# Patient Record
Sex: Female | Born: 2000 | Hispanic: No | Marital: Single | Smoking: Never smoker
Health system: Southern US, Community
[De-identification: ages and names within clinical notes are randomized; demographics above are authoritative.]

## PROBLEM LIST (undated history)

## (undated) DIAGNOSIS — J45909 Unspecified asthma, uncomplicated: Secondary | ICD-10-CM

## (undated) DIAGNOSIS — D649 Anemia, unspecified: Secondary | ICD-10-CM

---
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
## 2021-03-10 ENCOUNTER — Other Ambulatory Visit: Payer: Self-pay

## 2021-03-10 ENCOUNTER — Emergency Department: Payer: Medicaid Other

## 2021-03-10 ENCOUNTER — Emergency Department
Admission: EM | Admit: 2021-03-10 | Discharge: 2021-03-10 | Disposition: A | Discharge status: Home / Self Care | Payer: Medicaid Other | Attending: Emergency Medicine | Admitting: Emergency Medicine

## 2021-03-10 ENCOUNTER — Encounter: Payer: Self-pay | Admitting: Emergency Medicine

## 2021-03-10 DIAGNOSIS — Z7951 Long term (current) use of inhaled steroids: Secondary | ICD-10-CM | POA: Diagnosis not present

## 2021-03-10 DIAGNOSIS — J4521 Mild intermittent asthma with (acute) exacerbation: Secondary | ICD-10-CM | POA: Diagnosis not present

## 2021-03-10 DIAGNOSIS — R0602 Shortness of breath: Secondary | ICD-10-CM | POA: Diagnosis present

## 2021-03-10 HISTORY — DX: Unspecified asthma, uncomplicated: J45.909

## 2021-03-10 MED ORDER — PREDNISONE 20 MG PO TABS
60.0000 mg | ORAL_TABLET | Freq: Once | ORAL | Status: AC
  Administered 2021-03-10: 60 mg via ORAL
  Filled 2021-03-10: qty 3

## 2021-03-10 MED ORDER — PREDNISONE 10 MG PO TABS: ORAL_TABLET | ORAL | 0 refills | Status: DC

## 2021-03-10 MED ORDER — IPRATROPIUM-ALBUTEROL 0.5-2.5 (3) MG/3ML IN SOLN
3.0000 mL | Freq: Once | RESPIRATORY_TRACT | Status: AC
  Administered 2021-03-10: 3 mL via RESPIRATORY_TRACT
  Filled 2021-03-10: qty 3

## 2021-03-10 MED ORDER — ALBUTEROL SULFATE HFA 108 (90 BASE) MCG/ACT IN AERS: 2.0000 | INHALATION_SPRAY | Freq: Four times a day (QID) | RESPIRATORY_TRACT | 2 refills | Status: DC | PRN

## 2021-03-10 NOTE — ED Notes (Signed)
Pt states hx of asthma and recently moved here and does not have inhaler/new prescriptions

## 2021-03-10 NOTE — ED Provider Notes (Signed)
[facility] Emergency Department Provider Note  ____________________________________________   Event Date/Time   First MD Initiated Contact with Patient 03/10/21 [id number]     (approximate)  I have reviewed the triage vital signs and the nursing notes.   HISTORY  Chief Complaint Asthma   HPI April Olsen is a 20 y.o. female presents to the ED with complaint of exacerbation of her asthma.  Patient states that she has had asthma most of her life.  She woke this morning with shortness of breath and her inhaler had expired.  She denies any fever or chills.       Past Medical History:  Diagnosis Date  . Asthma     There are no problems to display for this patient.   History reviewed. No pertinent surgical history.  Prior to Admission medications   Medication Sig Start Date End Date Taking? Authorizing Provider  albuterol (VENTOLIN HFA) 108 (90 Base) MCG/ACT inhaler Inhale 2 puffs into the lungs every 6 (six) hours as needed for wheezing or shortness of breath. 03/10/21  Yes Tommi Rumps, PA-C  predniSONE (DELTASONE) 10 MG tablet Take 5 tablets tomorrow, on day 2 take 4 tablets, day 3 take 3 tablets, day 4 take 2 tablets, day 5 take 1 tablets. 03/10/21  Yes Bridget Hartshorn L, PA-C    Allergies Motrin [ibuprofen] and Sulfa antibiotics  No family history on file.  Social History Social History   Tobacco Use  . Smoking status: Never Smoker  . Smokeless tobacco: Never Used  Vaping Use  . Vaping Use: Never used    Review of Systems Constitutional: No fever/chills Eyes: No visual changes. ENT: No sore throat. Cardiovascular: Denies chest pain. Respiratory: Positive shortness of breath with wheezing. Gastrointestinal: No abdominal pain.  No nausea, no vomiting.  No diarrhea. Musculoskeletal: Negative for musculoskeletal pain. Skin: Negative for rash. Neurological: Negative for headaches, focal weakness or  numbness.  ____________________________________________   PHYSICAL EXAM:  VITAL SIGNS: ED Triage Vitals  Enc Vitals Group     BP 03/10/21 0633 123/72     Pulse Rate 03/10/21 0633 79     Resp 03/10/21 0633 20     Temp 03/10/21 0633 97.9 F (36.6 C)     Temp Source 03/10/21 0633 Oral     SpO2 03/10/21 0633 95 %     Weight 03/10/21 0634 155 lb (70.3 kg)     Height 03/10/21 0634 5\' 7"  (1.702 m)     Head Circumference --      Peak Flow --      Pain Score --      Pain Loc --      Pain Edu? --      Excl. in GC? --    Constitutional: Alert and oriented. Well appearing and in no acute distress. Eyes: Conjunctivae are normal. PERRL. EOMI. Head: Atraumatic. Nose: No congestion/rhinnorhea. Mouth/Throat: Mucous membranes are moist.  Oropharynx non-erythematous. Neck: No stridor.   Cardiovascular: Normal rate, regular rhythm. Grossly normal heart sounds.  Good peripheral circulation. Respiratory: Normal respiratory effort.  No retractions. Lungs bilateral expiratory wheezes are heard throughout.  Patient is still able to speak in complete sentences without any difficulty. Gastrointestinal: Soft and nontender. No distention.  Musculoskeletal: Moves upper and lower extremities that any difficulty normal gait was noted. Neurologic:  Normal speech and language. No gross focal neurologic deficits are appreciated. No gait instability. Skin:  Skin is warm, dry and intact. No rash noted. Psychiatric: Mood and affect are  normal. Speech and behavior are normal.  ____________________________________________   LABS (all labs ordered are listed, but only abnormal results are displayed)  Labs Reviewed - No data to display ____________________________________________   RADIOLOGY I, Tommi Rumps, personally viewed and evaluated these images (plain radiographs) as part of my medical decision making, as well as reviewing the written report by the radiologist.   Official radiology  report(s): DG Chest 2 View  Result Date: 03/10/2021 CLINICAL DATA:  20 year old female with nonproductive cough and shortness of breath. EXAM: CHEST - 2 VIEW COMPARISON:  None. FINDINGS: Normal lung volumes and mediastinal contours. Visualized tracheal air column is within normal limits. Both lungs appear clear. No pneumothorax or pleural effusion. No osseous abnormality identified. Negative visible bowel gas pattern. IMPRESSION: Negative.  No acute cardiopulmonary abnormality. Electronically Signed   By: Odessa Fleming M.D.   On: 03/10/2021 07:21    ____________________________________________   PROCEDURES  Procedure(s) performed (including Critical Care):  Procedures   ____________________________________________   INITIAL IMPRESSION / ASSESSMENT AND PLAN / ED COURSE  As part of my medical decision making, I reviewed the following data within the electronic MEDICAL RECORD NUMBER Notes from prior ED visits and [place] Controlled Substance Database  20 year old female presents to the ED with exacerbation of her asthma.  Patient states that her inhaler had expired and she woke up this morning with her usual asthma.  She was given a DuoNeb treatment while in the ED along with 60 mg of prednisone and began improving.  O2 sat was 95% and patient was ambulatory without any respiratory distress.  She is still able to speak in complete sentences without any difficulty.  A prescription for an albuterol inhaler and prednisone for the next 5 days was sent to her pharmacy.  She is to follow-up with her PCP if any continued problems.  She is also given strict return precautions should she develop any worsening of her symptoms.  ____________________________________________   FINAL CLINICAL IMPRESSION(S) / ED DIAGNOSES  Final diagnoses:  Mild intermittent asthma with exacerbation     ED Discharge Orders         Ordered    albuterol (VENTOLIN HFA) 108 (90 Base) MCG/ACT inhaler  Every 6 hours PRN        03/10/21  0851    predniSONE (DELTASONE) 10 MG tablet        03/10/21 4008           Note:  This document was prepared using Dragon voice recognition software and may include unintentional dictation errors.    Tommi Rumps, PA-C 03/10/21 0857    Gilles Chiquito, MD 03/10/21 [phone]

## 2021-03-10 NOTE — Discharge Instructions (Signed)
Follow-up with your primary care provider if any continued problems or concerns.  A prescription for an albuterol inhaler and the remainder of your prednisone was sent to the pharmacy.  Take as directed.  Return to the emergency department if any severe worsening of your symptoms such as difficulty breathing or shortness of breath.

## 2021-03-10 NOTE — ED Notes (Signed)
Pt states feeling better after breathing tx

## 2021-03-10 NOTE — ED Triage Notes (Signed)
Pt arrived via POV with reports of asthma problem, pt states she does not have an inhaler. Woke up during the night with dry cough, states she was short of breath and if she had been able to use her inhaler she would have felt better.  No distress noted on arrival, pt able to speak in complete sentences.

## 2021-03-23 ENCOUNTER — Encounter: Payer: Self-pay | Admitting: Emergency Medicine

## 2021-03-23 ENCOUNTER — Emergency Department
Admission: EM | Admit: 2021-03-23 | Discharge: 2021-03-23 | Disposition: A | Discharge status: Home / Self Care | Payer: Medicaid Other | Attending: Emergency Medicine | Admitting: Emergency Medicine

## 2021-03-23 ENCOUNTER — Other Ambulatory Visit: Payer: Self-pay

## 2021-03-23 ENCOUNTER — Emergency Department: Payer: Medicaid Other

## 2021-03-23 DIAGNOSIS — Z3A01 Less than 8 weeks gestation of pregnancy: Secondary | ICD-10-CM | POA: Insufficient documentation

## 2021-03-23 DIAGNOSIS — J45909 Unspecified asthma, uncomplicated: Secondary | ICD-10-CM | POA: Insufficient documentation

## 2021-03-23 DIAGNOSIS — O219 Vomiting of pregnancy, unspecified: Secondary | ICD-10-CM | POA: Diagnosis not present

## 2021-03-23 DIAGNOSIS — R102 Pelvic and perineal pain: Secondary | ICD-10-CM

## 2021-03-23 LAB — URINALYSIS, COMPLETE (UACMP) WITH MICROSCOPIC
Bilirubin Urine: NEGATIVE
Glucose, UA: NEGATIVE mg/dL
Hgb urine dipstick: NEGATIVE
Ketones, ur: 80 mg/dL — AB
Nitrite: NEGATIVE
Protein, ur: 100 mg/dL — AB
Specific Gravity, Urine: 1.032 — ABNORMAL HIGH (ref 1.005–1.030)
pH: 6 (ref 5.0–8.0)

## 2021-03-23 LAB — COMPREHENSIVE METABOLIC PANEL
ALT: 11 U/L (ref 0–44)
AST: 23 U/L (ref 15–41)
Albumin: 4.9 g/dL (ref 3.5–5.0)
Alkaline Phosphatase: 53 U/L (ref 38–126)
Anion gap: 15 (ref 5–15)
BUN: 13 mg/dL (ref 6–20)
CO2: 20 mmol/L — ABNORMAL LOW (ref 22–32)
Calcium: 10.1 mg/dL (ref 8.9–10.3)
Chloride: 101 mmol/L (ref 98–111)
Creatinine, Ser: 0.77 mg/dL (ref 0.44–1.00)
GFR, Estimated: >60 mL/min (ref >60)
Glucose, Bld: 80 mg/dL (ref 70–99)
Potassium: 4.1 mmol/L (ref 3.5–5.1)
Sodium: 136 mmol/L (ref 135–145)
Total Bilirubin: 2.4 mg/dL — ABNORMAL HIGH (ref 0.3–1.2)
Total Protein: 8.8 g/dL — ABNORMAL HIGH (ref 6.5–8.1)

## 2021-03-23 LAB — CBC
HCT: 43.2 % (ref 36.0–46.0)
Hemoglobin: 13.7 g/dL (ref 12.0–15.0)
MCH: 23.9 pg — ABNORMAL LOW (ref 26.0–34.0)
MCHC: 31.7 g/dL (ref 30.0–36.0)
MCV: 75.4 fL — ABNORMAL LOW (ref 80.0–100.0)
Platelets: 384 10*3/uL (ref 150–400)
RBC: 5.73 MIL/uL — ABNORMAL HIGH (ref 3.87–5.11)
RDW: 19.6 % — ABNORMAL HIGH (ref 11.5–15.5)
WBC: 7.3 10*3/uL (ref 4.0–10.5)
nRBC: 0 % (ref 0.0–0.2)

## 2021-03-23 LAB — LIPASE, BLOOD: Lipase: 25 U/L (ref 11–51)

## 2021-03-23 LAB — HCG, QUANTITATIVE, PREGNANCY: hCG, Beta Chain, Quant, S: 42102 m[IU]/mL — ABNORMAL HIGH (ref <5)

## 2021-03-23 MED ORDER — CEPHALEXIN 500 MG PO CAPS
1000.0000 mg | ORAL_CAPSULE | Freq: Once | ORAL | Status: AC
  Administered 2021-03-23: 1000 mg via ORAL
  Filled 2021-03-23: qty 2

## 2021-03-23 MED ORDER — SODIUM CHLORIDE 0.9 % IV BOLUS
1000.0000 mL | Freq: Once | INTRAVENOUS | Status: AC
  Administered 2021-03-23: 1000 mL via INTRAVENOUS

## 2021-03-23 MED ORDER — ONDANSETRON 4 MG PO TBDP: 4.0000 mg | ORAL_TABLET | Freq: Three times a day (TID) | ORAL | 0 refills | Status: DC | PRN

## 2021-03-23 MED ORDER — ONDANSETRON HCL 4 MG/2ML IJ SOLN
4.0000 mg | Freq: Once | INTRAMUSCULAR | Status: AC
  Administered 2021-03-23: 4 mg via INTRAVENOUS
  Filled 2021-03-23: qty 2

## 2021-03-23 MED ORDER — CEPHALEXIN 500 MG PO CAPS: 500.0000 mg | ORAL_CAPSULE | Freq: Four times a day (QID) | ORAL | 0 refills | Status: AC

## 2021-03-23 NOTE — Discharge Instructions (Addendum)
For your UTI, take the antibiotics as prescribed.   You make take over the counter medications for nausea and vomiting in pregnancy, such as taking 1/2 tablet of Unisom (12.5mg ) and 25 mg of B6 at night time. If this is not effective in treating your nausea and vomiting, you may use the prescribed Zofran every 8 hours.   Call OB on Monday to make a follow up appointment.   Return to the ER if you are unable to keep anything down despite your medications, or if you develop any other worsening symptoms.

## 2021-03-23 NOTE — ED Provider Notes (Signed)
[facility] Emergency Department Provider Note  ____________________________________________   Event Date/Time   First MD Initiated Contact with Patient 03/23/21 1225     (approximate)  I have reviewed the triage vital signs and the nursing notes.   HISTORY  Chief Complaint Emesis During Pregnancy   HPI April Olsen is a 20 y.o. female who reports to the ER for evaluation of nausea and vomiting for the last 3 days. She states she has not been able to keep anything down including water over that time. She reports recently taking a positive home pregnancy test, has not yet established with OB. She reports intermittent lower abdominal cramping, denies any vaginal bleeding or leaking, denies concern for STD, denies dysuria or fevers. She has history of healthy pregnancy 2 years ago. Reports LMP was approximately 4 weeks ago, states she is usually regular.         Past Medical History:  Diagnosis Date   Asthma     There are no problems to display for this patient.   History reviewed. No pertinent surgical history.  Prior to Admission medications   Medication Sig Start Date End Date Taking? Authorizing Provider  cephALEXin (KEFLEX) 500 MG capsule Take 1 capsule (500 mg total) by mouth 4 (four) times daily for 7 days. 03/23/21 03/30/21 Yes Jarman Litton, Ruben Gottron, PA  ondansetron (ZOFRAN ODT) 4 MG disintegrating tablet Take 1 tablet (4 mg total) by mouth every 8 (eight) hours as needed for nausea or vomiting. 03/23/21  Yes Lucy Chris, PA  albuterol (VENTOLIN HFA) 108 (90 Base) MCG/ACT inhaler Inhale 2 puffs into the lungs every 6 (six) hours as needed for wheezing or shortness of breath. 03/10/21   Tommi Rumps, PA-C  predniSONE (DELTASONE) 10 MG tablet Take 5 tablets tomorrow, on day 2 take 4 tablets, day 3 take 3 tablets, day 4 take 2 tablets, day 5 take 1 tablets. 03/10/21   Tommi Rumps, PA-C    Allergies Motrin [ibuprofen] and Sulfa  antibiotics  History reviewed. No pertinent family history.  Social History Social History   Tobacco Use   Smoking status: Never   Smokeless tobacco: Never  Vaping Use   Vaping Use: Never used    Review of Systems Constitutional: No fever/chills Eyes: No visual changes. ENT: No sore throat. Cardiovascular: Denies chest pain. Respiratory: Denies shortness of breath. Gastrointestinal: + abdominal cramping.  + nausea, + vomiting.  No diarrhea.  No constipation. Genitourinary: Negative for dysuria. Musculoskeletal: Negative for back pain. Skin: Negative for rash. Neurological: Negative for headaches, focal weakness or numbness.   ____________________________________________   PHYSICAL EXAM:  VITAL SIGNS: ED Triage Vitals  Enc Vitals Group     BP 03/23/21 1206 123/85     Pulse Rate 03/23/21 1206 94     Resp 03/23/21 1206 18     Temp 03/23/21 1206 97.8 F (36.6 C)     Temp Source 03/23/21 1206 Oral     SpO2 03/23/21 1206 98 %     Weight 03/23/21 1206 150 lb (68 kg)     Height 03/23/21 1206 5\' 6"  (1.676 m)     Head Circumference --      Peak Flow --      Pain Score 03/23/21 1210 7     Pain Loc --      Pain Edu? --      Excl. in GC? --    Constitutional: Alert and oriented. Well appearing and in no acute distress. Eyes:  Conjunctivae are normal. PERRL. EOMI. Head: Atraumatic. Nose: No congestion/rhinnorhea. Mouth/Throat: Mucous membranes are moist.  Oropharynx non-erythematous. Neck: No stridor.  Cardiovascular: Normal rate, regular rhythm. Grossly normal heart sounds.  Good peripheral circulation. Respiratory: Normal respiratory effort.  No retractions. Lungs CTAB. Gastrointestinal: Soft and nontender. No distention. No abdominal bruits. No CVA tenderness. Musculoskeletal: No lower extremity tenderness nor edema.  No joint effusions. Neurologic:  Normal speech and language. No gross focal neurologic deficits are appreciated. No gait instability. Skin:  Skin is  warm, dry and intact. No rash noted. Psychiatric: Mood and affect are normal. Speech and behavior are normal.  ____________________________________________   LABS (all labs ordered are listed, but only abnormal results are displayed)  Labs Reviewed  COMPREHENSIVE METABOLIC PANEL - Abnormal; Notable for the following components:      Result Value   CO2 20 (*)    Total Protein 8.8 (*)    Total Bilirubin 2.4 (*)    All other components within normal limits  CBC - Abnormal; Notable for the following components:   RBC 5.73 (*)    MCV 75.4 (*)    MCH 23.9 (*)    RDW 19.6 (*)    All other components within normal limits  URINALYSIS, COMPLETE (UACMP) WITH MICROSCOPIC - Abnormal; Notable for the following components:   Color, Urine YELLOW (*)    APPearance HAZY (*)    Specific Gravity, Urine 1.032 (*)    Ketones, ur 80 (*)    Protein, ur 100 (*)    Leukocytes,Ua SMALL (*)    Bacteria, UA RARE (*)    All other components within normal limits  HCG, QUANTITATIVE, PREGNANCY - Abnormal; Notable for the following components:   hCG, Beta Chain, Quant, S 42,102 (*)    All other components within normal limits  LIPASE, BLOOD   ____________________________________________  RADIOLOGY  Official radiology report(s): US OB LESS THAN 14 WEEKS WITH OB TRANSVAGINAL  Result Date: 03/23/2021 CLINICAL DATA:  Pregnant patient with abdominal cramping. EXAM: OBSTETRIC <14 WK [place] AND TRANSVAGINAL OB US TECHNIQUE: Both transabdominal and transvaginal ultrasound examinations were performed for complete evaluation of the gestation as well as the maternal uterus, adnexal regions, and pelvic cul-de-sac. Transvaginal technique was performed to assess early pregnancy. COMPARISON:  None. FINDINGS: Intrauterine gestational sac: Single Yolk sac:  Visualized. Embryo:  Visualized. Cardiac Activity: Visualized. Heart Rate: 99 bpm CRL:  3.1 mm   5 w   6 d                  [place] EDC: 11/17/2021 Subchorionic hemorrhage:  None  Maternal uterus/adnexae: Right ovarian corpus luteum. Normal left ovary. No free fluid in the pelvis. IMPRESSION: Single live intrauterine gestation.  No subchorionic hemorrhage. Electronically Signed   By: Annia Belt M.D.   On: 03/23/2021 14:15     ____________________________________________   INITIAL IMPRESSION / ASSESSMENT AND PLAN / ED COURSE  As part of my medical decision making, I reviewed the following data within the electronic MEDICAL RECORD NUMBER Nursing notes reviewed and incorporated, Labs reviewed, and Notes from prior ED visits        Patient is a 20 yo female who presents to the ER for evaluation for evaluation of abdominal pain, nausea and vomiting in pregnancy. See HPI for full details.  In triage, patient has normal vitals.   On PE, no gross abnormalities. Laboratory evaluation was obtained including CBC, CMP, quant hcg, urinalysis. Urine shows rare bacteria and small leukocytes as well as  80 ketones and proteinuria. Will initiate treatment for bacturia in pregnancy. Quant HCG is 42,102 and US shows normal IUP. No bleeding, thus no ABO/Rh obtained. Given 1 L of IV fluids as well as zofran with improvement in nausea/vomiting and is now tolerating PO at bedside. Will discharge with a short course of this medication with plans for close follow up OB care. Patient is amenable with plan, return precautions discussed at length and patient is stable at this time for outpatient follow up.       ____________________________________________   FINAL CLINICAL IMPRESSION(S) / ED DIAGNOSES  Final diagnoses:  Nausea and vomiting in pregnancy     ED Discharge Orders          Ordered    cephALEXin (KEFLEX) 500 MG capsule  4 times daily        03/23/21 1613    ondansetron (ZOFRAN ODT) 4 MG disintegrating tablet  Every 8 hours PRN        03/23/21 1613             Note:  This document was prepared using Dragon voice recognition software and may include unintentional  dictation errors.    Lucy Chris, PA 03/24/21 1537    Jene Every, MD 03/25/21 1440

## 2021-03-23 NOTE — ED Triage Notes (Signed)
Pt states recently found out she was pregnant, c/o N/V x 3 days. Pt states G2P1L1.

## 2021-05-15 ENCOUNTER — Ambulatory Visit (INDEPENDENT_AMBULATORY_CARE_PROVIDER_SITE_OTHER): Payer: Medicaid Other | Admitting: Certified Nurse Midwife

## 2021-05-15 ENCOUNTER — Other Ambulatory Visit: Payer: Self-pay

## 2021-05-15 ENCOUNTER — Encounter: Payer: Self-pay | Admitting: Certified Nurse Midwife

## 2021-05-15 ENCOUNTER — Other Ambulatory Visit (HOSPITAL_COMMUNITY)
Admission: RE | Admit: 2021-05-15 | Discharge: 2021-05-15 | Disposition: A | Discharge status: Home / Self Care | Payer: Self-pay | Source: Ambulatory Visit | Attending: Certified Nurse Midwife | Admitting: Certified Nurse Midwife

## 2021-05-15 DIAGNOSIS — Z0283 Encounter for blood-alcohol and blood-drug test: Secondary | ICD-10-CM

## 2021-05-15 DIAGNOSIS — Z3A13 13 weeks gestation of pregnancy: Secondary | ICD-10-CM | POA: Diagnosis not present

## 2021-05-15 DIAGNOSIS — Z3200 Encounter for pregnancy test, result unknown: Secondary | ICD-10-CM

## 2021-05-15 DIAGNOSIS — Z1379 Encounter for other screening for genetic and chromosomal anomalies: Secondary | ICD-10-CM

## 2021-05-15 DIAGNOSIS — Z3401 Encounter for supervision of normal first pregnancy, first trimester: Secondary | ICD-10-CM

## 2021-05-15 DIAGNOSIS — Z124 Encounter for screening for malignant neoplasm of cervix: Secondary | ICD-10-CM | POA: Insufficient documentation

## 2021-05-15 DIAGNOSIS — Z113 Encounter for screening for infections with a predominantly sexual mode of transmission: Secondary | ICD-10-CM

## 2021-05-15 LAB — POCT URINALYSIS DIPSTICK OB
Bilirubin, UA: NEGATIVE
Blood, UA: NEGATIVE
Glucose, UA: NEGATIVE
Ketones, UA: NEGATIVE
Leukocytes, UA: NEGATIVE
Nitrite, UA: NEGATIVE
POC,PROTEIN,UA: NEGATIVE
Spec Grav, UA: 1.025 (ref 1.010–1.025)
Urobilinogen, UA: 0.2 E.U./dL
pH, UA: 7.5 (ref 5.0–8.0)

## 2021-05-15 NOTE — Progress Notes (Signed)
NEW OB HISTORY AND PHYSICAL  SUBJECTIVE:       April Olsen is a 20 y.o. G1P0 female, Patient's last menstrual period was 02/24/2021 (within days)., Estimated Date of Delivery: None noted., Unknown, presents today for establishment of Prenatal Care. She has no unusual complaints   Relationship: female partner (father of baby) Living : with her partner and daughter Work: none Exercise : walks 2 x wk Alcohol/Drug/Smoking/vaping:  denies use    Gynecologic History Patient's last menstrual period was 02/24/2021 (within days). Normal Contraception: none Last Pap: has not had.   Obstetric History OB History  Gravida Para Term Preterm AB Living  1            SAB IAB Ectopic Multiple Live Births               # Outcome Date GA Lbr Len/2nd Weight Sex Delivery Anes PTL Lv  1 Current             Past Medical History:  Diagnosis Date   Asthma     No past surgical history on file.  Current Outpatient Medications on File Prior to Visit  Medication Sig Dispense Refill   albuterol (VENTOLIN HFA) 108 (90 Base) MCG/ACT inhaler Inhale 2 puffs into the lungs every 6 (six) hours as needed for wheezing or shortness of breath. 8 g 2   ondansetron (ZOFRAN ODT) 4 MG disintegrating tablet Take 1 tablet (4 mg total) by mouth every 8 (eight) hours as needed for nausea or vomiting. 20 tablet 0   predniSONE (DELTASONE) 10 MG tablet Take 5 tablets tomorrow, on day 2 take 4 tablets, day 3 take 3 tablets, day 4 take 2 tablets, day 5 take 1 tablets. 15 tablet 0   No current facility-administered medications on file prior to visit.    Allergies  Allergen Reactions   Motrin [Ibuprofen] Hives   Sulfa Antibiotics Hives    Social History   Socioeconomic History   Marital status: Single    Spouse name: Not on file   Number of children: Not on file   Years of education: Not on file   Highest education level: Not on file  Occupational History   Not on file  Tobacco Use   Smoking status: Never    Smokeless tobacco: Never  Vaping Use   Vaping Use: Never used  Substance and Sexual Activity   Alcohol use: Not on file   Drug use: Not on file   Sexual activity: Not on file  Other Topics Concern   Not on file  Social History Narrative   Not on file   Social Determinants of Health   Financial Resource Strain: Not on file  Food Insecurity: Not on file  Transportation Needs: Not on file  Physical Activity: Not on file  Stress: Not on file  Social Connections: Not on file  Intimate Partner Violence: Not on file    No family history on file.  The following portions of the patient's history were reviewed and updated as appropriate: allergies, current medications, past OB history, past medical history, past surgical history, past family history, past social history, and problem list.    OBJECTIVE: Initial Physical Exam (New OB)  GENERAL APPEARANCE: alert, well appearing, in no apparent distress, oriented to person, place and time HEAD: normocephalic, atraumatic MOUTH: mucous membranes moist, pharynx normal without lesions THYROID: no thyromegaly or masses present BREASTS: no masses noted, no significant tenderness, no palpable axillary nodes, no skin changes LUNGS: clear to auscultation,  no wheezes, rales or rhonchi, symmetric air entry HEART: regular rate and rhythm, no murmurs ABDOMEN: soft, nontender, nondistended, no abnormal masses, no epigastric pain and FHT present EXTREMITIES: no redness or tenderness in the calves or thighs SKIN: normal coloration and turgor, no rashes LYMPH NODES: no adenopathy palpable NEUROLOGIC: alert, oriented, normal speech, no focal findings or movement disorder noted  PELVIC EXAM EXTERNAL GENITALIA: normal appearing vulva with no masses, tenderness or lesions VAGINA: no abnormal discharge or lesions CERVIX: no lesions or cervical motion tenderness, contact beeding, pap collected UTERUS: gravid ADNEXA: no masses palpable and nontender OB  EXAM PELVIMETRY: appears adequate RECTUM: exam not indicated  ASSESSMENT: Normal pregnancy  PLAN: Prenatal care See orders New OB counseling: The patient has been given an overview regarding routine prenatal care. Recommendations regarding diet, weight gain, and exercise in pregnancy were given. Prenatal testing, optional genetic testing, carrier screening test, and ultrasound use in pregnancy were reviewed. Benefits of Breast Feeding were discussed. The patient is encouraged to consider nursing her baby post partum. Maternit 21 today.   Doreene Burke, CNM

## 2021-05-15 NOTE — Patient Instructions (Signed)
https://www.acog.org/womens-[email or web address]/faqs/[email or web address]-[email or web address]  Prenatal Care Prenatal care is health care during pregnancy. It helps you and your unborn baby (fetus) stay as healthy as possible. Prenatal care may be provided by a midwife, a family practice doctor, a mid-level practitioner (nurse practitioner or physician assistant), or a childbirth and pregnancy doctor (obstetrician). How does this affect me? During pregnancy, you will be closely monitored for any new conditions that might develop. To lower your risk of pregnancy complications, you and yourhealth care provider will talk about any underlying conditions you have. How does this affect my baby? Early and consistent prenatal care increases the chance that your baby will be healthy during pregnancy. Prenatal care lowers the risk that your baby will be: Born early (prematurely). Smaller than expected at birth (small for gestational age). What can I expect at the first prenatal care visit? Your first prenatal care visit will likely be the longest. You should schedule your first prenatal care visit as soon as you know that you are pregnant. Your first visit is a good time to talk about any questions or concerns you haveabout pregnancy. Medical history At your visit, you and your health care provider will talk about your medical history, including: Any past pregnancies. Your family's medical history. Medical history of the baby's father. Any long-term (chronic) health conditions you have and how you manage them. Any surgeries or procedures you have had. Any current over-the-counter or prescription medicines, herbs, or supplements that you are taking. Other factors that could pose a risk to your baby, including: Exposure to harmful chemicals or radiation at work or at home. Any substance use, including tobacco, alcohol, and drug use. Your home setting and your stress levels, including: Exposure to abuse or  violence. Household financial strain. Your daily health habits, including diet and exercise. Tests and screenings Your health care provider will: Measure your weight, height, and blood pressure. Do a physical exam, including a pelvic and breast exam. Perform blood tests and urine tests to check for: Urinary tract infection. Sexually transmitted infections (STIs). Low iron levels in your blood (anemia). Blood type and certain proteins on red blood cells (Rh antibodies). Infections and immunity to viruses, such as hepatitis B and rubella. HIV (human immunodeficiency virus). Discuss your options for genetic screening. Tips about staying healthy Your health care provider will also give you information about how to keep yourself and your baby healthy, including: Nutrition and taking vitamins. Physical activity. How to manage pregnancy symptoms such as nausea and vomiting (morning sickness). Infections and substances that may be harmful to your baby and how to avoid them. Food safety. Dental care. Working. Travel. Warning signs to watch for and when to call your health care provider. How often will I have prenatal care visits? After your first prenatal care visit, you will have regular visits throughout your pregnancy. The visit schedule is often as follows: Up to week 28 of pregnancy: once every 4 weeks. 28-36 weeks: once every 2 weeks. After 36 weeks: every week until delivery. Some women may have visits more or less often depending on any underlyinghealth conditions and the health of the baby. Keep all follow-up and prenatal care visits. This is important. What happens during routine prenatal care visits? Your health care provider will: Measure your weight and blood pressure. Check for fetal heart sounds. Measure the height of your uterus in your abdomen (fundal height). This may be measured starting around week 20 of pregnancy. Check the position of your baby inside your  uterus. Ask questions   about your diet, sleeping patterns, and whether you can feel the baby move. Review warning signs to watch for and signs of labor. Ask about any pregnancy symptoms you are having and how you are dealing with them. Symptoms may include: Headaches. Nausea and vomiting. Vaginal discharge. Swelling. Fatigue. Constipation. Changes in your vision. Feeling persistently sad or anxious. Any discomfort, including back or pelvic pain. Bleeding or spotting. Make a list of questions to ask your health care provider at your routinevisits. What tests might I have during prenatal care visits? You may have blood, urine, and imaging tests throughout your pregnancy, such as: Urine tests to check for glucose, protein, or signs of infection. Glucose tests to check for a form of diabetes that can develop during pregnancy (gestational diabetes mellitus). This is usually done around week 24 of pregnancy. Ultrasounds to check your baby's growth and development, to check for birth defects, and to check your baby's well-being. These can also help to decide when you should deliver your baby. A test to check for group B strep (GBS) infection. This is usually done around week 36 of pregnancy. Genetic testing. This may include blood, fluid, or tissue sampling, or imaging tests, such as an ultrasound. Some genetic tests are done during the first trimester and some are done during the second trimester. What else can I expect during prenatal care visits? Your health care provider may recommend getting certain vaccines during pregnancy. These may include: A yearly flu shot (annual influenza vaccine). This is especially important if you will be pregnant during flu season. Tdap (tetanus, diphtheria, pertussis) vaccine. Getting this vaccine during pregnancy can protect your baby from whooping cough (pertussis) after birth. This vaccine may be recommended between weeks 27 and 36 of pregnancy. A COVID-19  vaccine. Later in your pregnancy, your health care provider may give you information about: Childbirth and breastfeeding classes. Choosing a health care provider for your baby. Umbilical cord banking. Breastfeeding. Birth control after your baby is born. The hospital labor and delivery unit and how to set up a tour. Registering at the hospital before you go into labor. Where to find more information Office on Women's Health: womenshealth.gov American Pregnancy Association: americanpregnancy.org March of Dimes: marchofdimes.org Summary Prenatal care helps you and your baby stay as healthy as possible during pregnancy. Your first prenatal care visit will most likely be the longest. You will have visits and tests throughout your pregnancy to monitor your health and your baby's health. Bring a list of questions to your visits to ask your health care provider. Make sure to keep all follow-up and prenatal care visits. This information is not intended to replace advice given to you by your health care provider. Make sure you discuss any questions you have with your healthcare provider. Document Revised: 07/05/2020 Document Reviewed: 07/05/2020 Elsevier Patient Education  2022 Elsevier Inc.  

## 2021-05-16 LAB — URINALYSIS, ROUTINE W REFLEX MICROSCOPIC
Bilirubin, UA: NEGATIVE
Glucose, UA: NEGATIVE
Ketones, UA: NEGATIVE
Leukocytes,UA: NEGATIVE
Nitrite, UA: NEGATIVE
Specific Gravity, UA: 1.021 (ref 1.005–1.030)
Urobilinogen, Ur: 1 mg/dL (ref 0.2–1.0)
pH, UA: 8.5 — ABNORMAL HIGH (ref 5.0–7.5)

## 2021-05-16 LAB — MICROSCOPIC EXAMINATION
Bacteria, UA: NONE SEEN
Casts: NONE SEEN /lpf
RBC, Urine: >30 /hpf — AB (ref 0–2)

## 2021-05-17 ENCOUNTER — Encounter: Payer: Medicaid Other | Admitting: Certified Nurse Midwife

## 2021-05-17 LAB — RUBELLA SCREEN: Rubella Antibodies, IGG: 1.05 index (ref >0.99)

## 2021-05-17 LAB — CYTOLOGY - PAP: Diagnosis: NEGATIVE

## 2021-05-17 LAB — ABO AND RH: Rh Factor: POSITIVE

## 2021-05-17 LAB — URINE CULTURE, OB REFLEX

## 2021-05-17 LAB — CBC WITH DIFFERENTIAL/PLATELET
Basophils Absolute: 0 10*3/uL (ref 0.0–0.2)
Basos: 0 %
EOS (ABSOLUTE): 0.2 10*3/uL (ref 0.0–0.4)
Eos: 2 %
Hematocrit: 31.9 % — ABNORMAL LOW (ref 34.0–46.6)
Hemoglobin: 10.1 g/dL — ABNORMAL LOW (ref 11.1–15.9)
Immature Grans (Abs): 0 10*3/uL (ref 0.0–0.1)
Immature Granulocytes: 0 %
Lymphocytes Absolute: 2.5 10*3/uL (ref 0.7–3.1)
Lymphs: 29 %
MCH: 25.3 pg — ABNORMAL LOW (ref 26.6–33.0)
MCHC: 31.7 g/dL (ref 31.5–35.7)
MCV: 80 fL (ref 79–97)
Monocytes Absolute: 0.6 10*3/uL (ref 0.1–0.9)
Monocytes: 7 %
Neutrophils Absolute: 5.2 10*3/uL (ref 1.4–7.0)
Neutrophils: 62 %
Platelets: 307 10*3/uL (ref 150–450)
RBC: 4 x10E6/uL (ref 3.77–5.28)
RDW: 19.3 % — ABNORMAL HIGH (ref 11.7–15.4)
WBC: 8.5 10*3/uL (ref 3.4–10.8)

## 2021-05-17 LAB — HEPATITIS B SURFACE ANTIGEN: Hepatitis B Surface Ag: NEGATIVE

## 2021-05-17 LAB — CULTURE, OB URINE

## 2021-05-17 LAB — VARICELLA ZOSTER ANTIBODY, IGG: Varicella zoster IgG: <135 index — ABNORMAL LOW (ref >165)

## 2021-05-17 LAB — ANTIBODY SCREEN: Antibody Screen: NEGATIVE

## 2021-05-17 LAB — GC/CHLAMYDIA PROBE AMP
Chlamydia trachomatis, NAA: NEGATIVE
Neisseria Gonorrhoeae by PCR: NEGATIVE

## 2021-05-17 LAB — HIV ANTIBODY (ROUTINE TESTING W REFLEX): HIV Screen 4th Generation wRfx: NONREACTIVE

## 2021-05-17 LAB — RPR: RPR Ser Ql: NONREACTIVE

## 2021-05-19 LAB — MATERNIT 21 PLUS CORE, BLOOD
Fetal Fraction: 7
Result (T21): NEGATIVE
Trisomy 13 (Patau syndrome): NEGATIVE
Trisomy 18 (Edwards syndrome): NEGATIVE
Trisomy 21 (Down syndrome): NEGATIVE

## 2021-05-21 LAB — MONITOR DRUG PROFILE 14(MW)
Amphetamine Scrn, Ur: NEGATIVE ng/mL
BARBITURATE SCREEN URINE: NEGATIVE ng/mL
BENZODIAZEPINE SCREEN, URINE: NEGATIVE ng/mL
Buprenorphine, Urine: NEGATIVE ng/mL
Cocaine (Metab) Scrn, Ur: NEGATIVE ng/mL
Creatinine(Crt), U: 130.1 mg/dL (ref 20.0–300.0)
Fentanyl, Urine: NEGATIVE pg/mL
Meperidine Screen, Urine: NEGATIVE ng/mL
Methadone Screen, Urine: NEGATIVE ng/mL
OXYCODONE+OXYMORPHONE UR QL SCN: NEGATIVE ng/mL
Opiate Scrn, Ur: NEGATIVE ng/mL
Ph of Urine: 8.3 (ref 4.5–8.9)
Phencyclidine Qn, Ur: NEGATIVE ng/mL
Propoxyphene Scrn, Ur: NEGATIVE ng/mL
SPECIFIC GRAVITY: 1.026
Tramadol Screen, Urine: NEGATIVE ng/mL

## 2021-05-21 LAB — NICOTINE SCREEN, URINE: Cotinine Ql Scrn, Ur: NEGATIVE ng/mL

## 2021-05-21 LAB — CANNABINOID (GC/MS), URINE
Cannabinoid: POSITIVE — AB
Carboxy THC (GC/MS): 10 ng/mL

## 2021-06-12 ENCOUNTER — Encounter: Payer: Medicaid Other | Admitting: Certified Nurse Midwife

## 2021-06-20 NOTE — Patient Instructions (Signed)
Second Trimester of Pregnancy The second trimester of pregnancy is from week 13 through week 27. This is also called months 4 through 6 of pregnancy. This is often the time when you feel your best. During the second trimester: Morning sickness is less or has stopped. You may have more energy. You may feel hungry more often. At this time, your unborn baby (fetus) is growing very fast. At the end of the sixth month, the unborn baby may be up to 12 inches long and weigh about 1 pounds. You will likely start to feel the baby move between 16 and 20 weeks of pregnancy. Body changes during your second trimester Your body continues to go through many changes during this time. The changes vary and generally return to normal after the baby is born. Physical changes You will gain more weight. You may start to get stretch marks on your hips, belly (abdomen), and breasts. Your breasts will grow and may hurt. Dark spots or blotches may develop on your face. A dark line from your belly button to the pubic area (linea nigra) may appear. You may have changes in your hair. Health changes You may have headaches. You may have heartburn. You may have trouble pooping (constipation). You may have hemorrhoids or swollen, bulging veins (varicose veins). Your gums may bleed. You may pee (urinate) more often. You may have back pain. Follow these instructions at home: Medicines Take over-the-counter and prescription medicines only as told by your doctor. Some medicines are not safe during pregnancy. Take a prenatal vitamin that contains at least 600 micrograms (mcg) of folic acid. Eating and drinking Eat healthy meals that include: Fresh fruits and vegetables. Whole grains. Good sources of protein, such as meat, eggs, or tofu. Low-fat dairy products. Avoid raw meat and unpasteurized juice, milk, and cheese. You may need to take these actions to prevent or treat trouble pooping: Drink enough fluids to keep  your pee (urine) pale yellow. Eat foods that are high in fiber. These include beans, whole grains, and fresh fruits and vegetables. Limit foods that are high in fat and sugar. These include fried or sweet foods. Activity Exercise only as told by your doctor. Most people can do their usual exercise during pregnancy. Try to exercise for 30 minutes at least 5 days a week. Stop exercising if you have pain or cramps in your belly or lower back. Do not exercise if it is too hot or too humid, or if you are in a place of great height (high altitude). Avoid heavy lifting. If you choose to, you may have sex unless your doctor tells you not to. Relieving pain and discomfort Wear a good support bra if your breasts are sore. Take warm water baths (sitz baths) to soothe pain or discomfort caused by hemorrhoids. Use hemorrhoid cream if your doctor approves. Rest with your legs raised (elevated) if you have leg cramps or low back pain. If you develop bulging veins in your legs: Wear support hose as told by your doctor. Raise your feet for 15 minutes, 3-4 times a day. Limit salt in your food. Safety Wear your seat belt at all times when you are in a car. Talk with your doctor if someone is hurting you or yelling at you a lot. Lifestyle Do not use hot tubs, steam rooms, or saunas. Do not douche. Do not use tampons or scented sanitary pads. Avoid cat litter boxes and soil used by cats. These carry germs that can harm your baby and   can cause a loss of your baby by miscarriage or stillbirth. Do not use herbal medicines, illegal drugs, or medicines that are not approved by your doctor. Do not drink alcohol. Do not smoke or use any products that contain nicotine or tobacco. If you need help quitting, ask your doctor. General instructions Keep all follow-up visits. This is important. Ask your doctor about local prenatal classes. Ask your doctor about the right foods to eat or for help finding a  counselor. Where to find more information American Pregnancy Association: americanpregnancy.org [employer]stetricians [employer] Gynecologists: www.acog.org Office on [employer] Health: KeywordPortfolios.com.br Contact a doctor if: You have a headache that does not go away when you take medicine. You have changes in how you see, or you see spots in front of your eyes. You have mild cramps, pressure, or pain in your lower belly. You continue to feel like you may vomit (nauseous), you vomit, or you have watery poop (diarrhea). You have bad-smelling fluid coming from your vagina. You have pain when you pee or your pee smells bad. You have very bad swelling of your face, hands, ankles, feet, or legs. You have a fever. Get help right away if: You are leaking fluid from your vagina. You have spotting or bleeding from your vagina. You have very bad belly cramping or pain. You have trouble breathing. You have chest pain. You faint. You have not felt your baby move for the time period told by your doctor. You have new or increased pain, swelling, or redness in an arm or leg. Summary The second trimester of pregnancy is from week 13 through week 27 (months 4 through 6). Eat healthy meals. Exercise as told by your doctor. Most people can do their usual exercise during pregnancy. Do not use herbal medicines, illegal drugs, or medicines that are not approved by your doctor. Do not drink alcohol. Call your doctor if you get sick or if you notice anything unusual about your pregnancy. This information is not intended to replace advice given to you by your health care provider. Make sure you discuss any questions you have with your health care provider. Document Revised: 02/29/2020 Document Reviewed: 01/05/2020 Elsevier Patient Education  [place]. Influenza (Flu) Vaccine (Inactivated or Recombinant): What You Need to Know 1. Why get vaccinated? Influenza vaccine can prevent influenza  (flu). Flu is a contagious disease that spreads around the [place] every year, usually between October and May. Anyone can get the flu, but it is more dangerous for some people. Infants and young children, people 67 years and older, pregnant people, and people with certain health conditions or a weakened immune system are at greatest risk of flu complications. Pneumonia, bronchitis, sinus infections, and ear infections are examples of flu-related complications. If you have a medical condition, such as heart disease, cancer, or diabetes, flu can make it worse. Flu can cause fever and chills, sore throat, muscle aches, fatigue, cough, headache, and runny or stuffy nose. Some people may have vomiting and diarrhea, though this is more common in children than adults. In an average year, thousands of people in the [place] die from flu, and many more are hospitalized. Flu vaccine prevents millions of illnesses and flu-related visits to the doctor each year. 2. Influenza vaccines CDC recommends everyone 6 months and older get vaccinated every flu season. Children 6 months through 77 years of age may need 2 doses during a single flu season. Everyone else needs only 1 dose each flu  season. It takes about 2 weeks for protection to develop after vaccination. There are many flu viruses, and they are always changing. Each year a new flu vaccine is made to protect against the influenza viruses believed to be likely to cause disease in the upcoming flu season. Even when the vaccine doesn't exactly match these viruses, it may still provide some protection. Influenza vaccine does not cause flu. Influenza vaccine may be given at the same time as other vaccines. 3. Talk with your health care provider Tell your vaccination provider if the person getting the vaccine: Has had an allergic reaction after a previous dose of influenza vaccine, or has any severe, life-threatening allergies Has ever had Guillain-Barr  Syndrome (also called "GBS") In some cases, your health care provider may decide to postpone influenza vaccination until a future visit. Influenza vaccine can be administered at any time during pregnancy. People who are or will be pregnant during influenza season should receive inactivated influenza vaccine. People with minor illnesses, such as a cold, may be vaccinated. People who are moderately or severely ill should usually wait until they recover before getting influenza vaccine. Your health care provider can give you more information. 4. Risks of a vaccine reaction Soreness, redness, and swelling where the shot is given, fever, muscle aches, and headache can happen after influenza vaccination. There may be a very small increased risk of Guillain-Barr Syndrome (GBS) after inactivated influenza vaccine (the flu shot). Young children who get the flu shot along with pneumococcal vaccine (PCV13) and/or DTaP vaccine at the same time might be slightly more likely to have a seizure caused by fever. Tell your health care provider if a child who is getting flu vaccine has ever had a seizure. People sometimes faint after medical procedures, including vaccination. Tell your provider if you feel dizzy or have vision changes or ringing in the ears. As with any medicine, there is a very remote chance of a vaccine causing a severe allergic reaction, other serious injury, or death. 5. What if there is a serious problem? An allergic reaction could occur after the vaccinated person leaves the clinic. If you see signs of a severe allergic reaction (hives, swelling of the face and throat, difficulty breathing, a fast heartbeat, dizziness, or weakness), call 9-1-1 and get the person to the nearest hospital. For other signs that concern you, call your health care provider. Adverse reactions should be reported to the Vaccine Adverse Event Reporting System (VAERS). Your health care provider will usually file this report,  or you can do it yourself. Visit the VAERS website at www.vaers.SamedayNews.es or call 563-744-3646. VAERS is only for reporting reactions, and VAERS staff members do not give medical advice. 6. The National Vaccine Injury Compensation Program The [employer] Vaccine Injury Compensation Program (VICP) is a federal program that was created to compensate people who may have been injured by certain vaccines. Claims regarding alleged injury or death due to vaccination have a time limit for filing, which may be as short as two years. Visit the VICP website at GoldCloset.com.ee or call 725-143-4168 to learn about the program and about filing a claim. 7. How can I learn more? Ask your health care provider. Call your local or state health department. Visit the website of the Food and Drug Administration (FDA) for vaccine package inserts and additional information at TraderRating.uy. Contact the Centers for Disease Control and Prevention (CDC): Call 815-183-6475 (1-800-CDC-INFO) or Visit CDC's website at https://gibson.com/. Vaccine Information Statement Inactivated Influenza Vaccine (05/11/2020) This information  is not intended to replace advice given to you by your health care provider. Make sure you discuss any questions you have with your health care provider. Document Revised: 06/28/2020 Document Reviewed: 06/28/2020 Elsevier Patient Education  [place]. Common Medications Safe in Pregnancy  Acne:      Constipation:  Benzoyl Peroxide     Colace  Clindamycin      Dulcolax Suppository  Topica Erythromycin     Fibercon  Salicylic Acid      Metamucil         Miralax AVOID:        Senakot   Accutane    Cough:  Retin-A       Cough Drops  Tetracycline      Phenergan w/ Codeine if Rx  Minocycline      Robitussin (Plain &  DM)  Antibiotics:     Crabs/Lice:  Ceclor       RID  Cephalosporins    AVOID:  E-Mycins      Kwell  Keflex  Macrobid/Macrodantin   Diarrhea:  Penicillin      Kao-Pectate  Zithromax      Imodium AD         PUSH FLUIDS AVOID:       Cipro     Fever:  Tetracycline      Tylenol (Regular or Extra  Minocycline       Strength)  Levaquin      Extra Strength-Do not          Exceed 8 tabs/24 hrs Caffeine:        <271m/day (equiv. To 1 cup of coffee or  approx. 3 12 oz sodas)         Gas: Cold/Hayfever:       Gas-X  Benadryl      Mylicon  Claritin       Phazyme  **Claritin-D        Chlor-Trimeton    Headaches:  Dimetapp      ASA-Free Excedrin  Drixoral-Non-Drowsy     Cold Compress  Mucinex (Guaifenasin)     Tylenol (Regular or Extra  Sudafed/Sudafed-12 Hour     Strength)  **Sudafed PE Pseudoephedrine   Tylenol Cold & Sinus     Vicks Vapor Rub  Zyrtec  **AVOID if Problems With Blood Pressure         Heartburn: Avoid lying down for at least 1 hour after meals  Aciphex      Maalox     Rash:  Milk of Magnesia     Benadryl    Mylanta       1% Hydrocortisone Cream  Pepcid  Pepcid Complete   Sleep Aids:  Prevacid      Ambien   Prilosec       Benadryl  Rolaids       Chamomile Tea  Tums (Limit 4/day)     Unisom         Tylenol PM         Warm milk-add vanilla or  Hemorrhoids:       Sugar for taste  Anusol/Anusol H.C.  (RX: Analapram 2.5%)  Sugar Substitutes:  Hydrocortisone OTC     Ok in moderation  Preparation H      Tucks        Vaseline lotion applied to tissue with wiping    Herpes:     Throat:  Acyclovir      Oragel  Famvir  Valtrex     Vaccines:  Flu Shot Leg Cramps:       *Gardasil  Benadryl      Hepatitis A         Hepatitis B Nasal Spray:       Pneumovax  Saline Nasal Spray     Polio Booster         Tetanus Nausea:       Tuberculosis test or PPD  Vitamin B6 25 mg TID   AVOID:    Dramamine      *Gardasil  Emetrol       Live Poliovirus  Ginger  Root 250 mg QID    MMR (measles, mumps &  High Complex Carbs @ Bedtime    rebella)  Sea Bands-Accupressure    Varicella (Chickenpox)  Unisom 1/2 tab TID     *No known complications           If received before Pain:         Known pregnancy;   Darvocet       Resume series after  Lortab        Delivery  Percocet    Yeast:   Tramadol      Femstat  Tylenol 3      Gyne-lotrimin  Ultram       Monistat  Vicodin           MISC:         All Sunscreens           Hair Coloring/highlights          Insect Repellant's          (Including DEET)         Mystic Tans

## 2021-06-21 ENCOUNTER — Ambulatory Visit (INDEPENDENT_AMBULATORY_CARE_PROVIDER_SITE_OTHER): Payer: Medicaid Other | Admitting: Certified Nurse Midwife

## 2021-06-21 ENCOUNTER — Other Ambulatory Visit: Payer: Self-pay

## 2021-06-21 ENCOUNTER — Encounter: Payer: Self-pay | Admitting: Certified Nurse Midwife

## 2021-06-21 VITALS — BP 103/68 | HR 79 | Wt 152.1 lb

## 2021-06-21 DIAGNOSIS — Z3482 Encounter for supervision of other normal pregnancy, second trimester: Secondary | ICD-10-CM

## 2021-06-21 DIAGNOSIS — Z3A18 18 weeks gestation of pregnancy: Secondary | ICD-10-CM

## 2021-06-21 LAB — POCT URINALYSIS DIPSTICK OB
Bilirubin, UA: NEGATIVE
Blood, UA: NEGATIVE
Glucose, UA: NEGATIVE
Ketones, UA: NEGATIVE
Nitrite, UA: NEGATIVE
POC,PROTEIN,UA: NEGATIVE
Spec Grav, UA: 1.025 (ref 1.010–1.025)
Urobilinogen, UA: 0.2 E.U./dL
pH, UA: 6.5 (ref 5.0–8.0)

## 2021-06-21 NOTE — Progress Notes (Signed)
ROB doing well, feeling some fluttering. Discussed antomy u/s , orders placed. Reviewed round ligament pain and self help measures. She verbalizes and agrees. Follow up 3 wks for rob.   Doreene Burke, CNM

## 2021-06-21 NOTE — Progress Notes (Signed)
OB-prenatal care. Pt stated that she was doing well. Declined flu vaccine until next visit.

## 2021-07-17 ENCOUNTER — Encounter: Payer: Self-pay | Admitting: Certified Nurse Midwife

## 2021-07-17 ENCOUNTER — Ambulatory Visit (INDEPENDENT_AMBULATORY_CARE_PROVIDER_SITE_OTHER): Payer: Medicaid Other

## 2021-07-17 ENCOUNTER — Ambulatory Visit (INDEPENDENT_AMBULATORY_CARE_PROVIDER_SITE_OTHER): Payer: Medicaid Other | Admitting: Certified Nurse Midwife

## 2021-07-17 ENCOUNTER — Other Ambulatory Visit: Payer: Self-pay

## 2021-07-17 VITALS — BP 101/63 | HR 78 | Wt 165.3 lb

## 2021-07-17 DIAGNOSIS — Z3482 Encounter for supervision of other normal pregnancy, second trimester: Secondary | ICD-10-CM

## 2021-07-17 DIAGNOSIS — Z3A22 22 weeks gestation of pregnancy: Secondary | ICD-10-CM

## 2021-07-17 LAB — POCT URINALYSIS DIPSTICK OB
Bilirubin, UA: NEGATIVE
Blood, UA: NEGATIVE
Glucose, UA: NEGATIVE
Ketones, UA: NEGATIVE
Leukocytes, UA: NEGATIVE
Nitrite, UA: NEGATIVE
POC,PROTEIN,UA: NEGATIVE
Spec Grav, UA: 1.01 (ref 1.010–1.025)
Urobilinogen, UA: 0.2 E.U./dL
pH, UA: 7.5 (ref 5.0–8.0)

## 2021-07-17 NOTE — Progress Notes (Signed)
PT declines flu

## 2021-07-17 NOTE — Patient Instructions (Signed)
Round Ligament Pain The round ligaments are a pair of cord-like tissues that help support the uterus. They can become a source of pain during pregnancy as the ligaments soften and stretch as the baby grows. The pain usually begins in the second trimester (13-28 weeks) of pregnancy, and should only last for a few seconds when it occurs. However, the pain can come and go until the baby is delivered. The pain does not cause harm to the baby. Round ligament pain is usually a short, sharp, and pinching pain, but it can also be a dull, lingering, and aching pain. The pain is felt in the lower side of the abdomen or in the groin. It usually starts deep in the groin and moves up to the outside of the hip area. The pain may happen when you: Suddenly change position, such as quickly going from a sitting to standing position. Do physical activity. Cough or sneeze. Follow these instructions at home: Managing pain  When the pain starts, relax. Then, try any of these methods to help with the pain: Sit down. Flex your knees up to your abdomen. Lie on your side with one pillow under your abdomen and another pillow between your legs. Sit in a warm bath for 15-20 minutes or until the pain goes away. General instructions Watch your condition for any changes. Move slowly when you sit down or stand up. Stop or reduce your physical activities if they cause pain. Avoid long walks if they cause pain. Take over-the-counter and prescription medicines only as told by your health care provider. Keep all follow-up visits. This is important. Contact a health care provider if: Your pain does not go away with treatment. You feel pain in your back that you did not have before. Your medicine is not helping. You have a fever or chills. You have nausea or vomiting. You have diarrhea. You have pain when you urinate. Get help right away if: You have pain that is a rhythmic, cramping pain similar to labor pains. Labor pains  are usually 2 minutes apart, last for about 1 minute, and involve a bearing down feeling or pressure in your pelvis. You have vaginal bleeding. These symptoms may represent a serious problem that is an emergency. Do not wait to see if the symptoms will go away. Get medical help right away. Call your local emergency services (911 in the [place] Do not drive yourself to the hospital. Summary Round ligament pain is felt in the lower abdomen or groin. This pain usually begins in the second trimester (13-28 weeks) and should only last for a few seconds when it occurs. You may notice the pain when you suddenly change position, when you cough or sneeze, or during physical activity. Relaxing, flexing your knees to your abdomen, lying on one side, or taking a warm bath may help to get rid of the pain. Contact your health care provider if the pain does not go away. This information is not intended to replace advice given to you by your health care provider. Make sure you discuss any questions you have with your health care provider. Document Revised: 12/05/2020 Document Reviewed: 12/05/2020 Elsevier Patient Education  2022 Elsevier Inc.  

## 2021-07-17 NOTE — Progress Notes (Signed)
ROB and u/s today for anatomy. Results reviewed ( see below). All questions answered . Follow up 4 wk.  Doreene Burke, CNM   Patient Name: April Olsen DOB: 11-01-2000 MRN: [id number]  ULTRASOUND REPORT  Location: [facility] Date of Service: 07/17/2021   Indications:Anatomy Ultrasound Findings:  Mason Jim intrauterine pregnancy is visualized with FHR at 139 BPM.   Biometrics give an (U/S) Gestational age of [redacted]w[redacted]d and an (U/S) EDD of 11/16/21; this correlates with the clinically established Estimated Date of Delivery: 11/16/21   Fetal presentation is Cephalic.  EFW: 546g / 1lb3oz. Placenta: posterior. Grade: 1 AFI: subjectively normal.  Anatomic survey is complete and normal; Gender - female.    Ovaries are not visualized. Survey of the adnexa demonstrates no adnexal masses.  There is no free peritoneal fluid in the cul de sac.  Impression: 1. [redacted]w[redacted]d Viable Singleton Intrauterine pregnancy by U/S. 2. (U/S) EDD is consistent with Clinically established Estimated Date of Delivery: 11/16/21 . 3. Normal Anatomy Scan  Recommendations: 1.Clinical correlation with the patient's History and Physical Exam.  Sheralyn Boatman  [place]

## 2021-08-14 ENCOUNTER — Other Ambulatory Visit: Payer: Self-pay

## 2021-08-14 ENCOUNTER — Ambulatory Visit (INDEPENDENT_AMBULATORY_CARE_PROVIDER_SITE_OTHER): Payer: Medicaid Other | Admitting: Certified Nurse Midwife

## 2021-08-14 VITALS — BP 103/68 | HR 94 | Wt 174.4 lb

## 2021-08-14 DIAGNOSIS — Z3A26 26 weeks gestation of pregnancy: Secondary | ICD-10-CM

## 2021-08-14 DIAGNOSIS — Z3482 Encounter for supervision of other normal pregnancy, second trimester: Secondary | ICD-10-CM

## 2021-08-14 LAB — POCT URINALYSIS DIPSTICK OB
Bilirubin, UA: NEGATIVE
Blood, UA: NEGATIVE
Glucose, UA: NEGATIVE
Ketones, UA: NEGATIVE
Leukocytes, UA: NEGATIVE
Nitrite, UA: NEGATIVE
POC,PROTEIN,UA: NEGATIVE
Spec Grav, UA: 1.015 (ref 1.010–1.025)
Urobilinogen, UA: 0.2 E.U./dL
pH, UA: 6 (ref 5.0–8.0)

## 2021-08-14 NOTE — Patient Instructions (Signed)
Postprandial Glucose Test Why am I having this test? You may have a postprandial glucose test (PPG test) to: Screen for diabetes (diabetes mellitus). More tests may be needed to confirm a diagnosis of diabetes. Check how well your diabetes management plan is working, if you are already diagnosed with diabetes. If you have diabetes, you may need to have PPG tests frequently, especially if you: Take more than one medicine and are at risk for high or low blood sugar (glucose). Have a history of high blood glucose after meals. Are a woman who is pregnant and has diabetes or a temporary form of diabetes that develops during pregnancy (gestational diabetes mellitus). Are on a new or changing insulin regimen. What is being tested? Your blood glucose level is tested 2 hours after you eat a meal. This provides information about how well your body responds to glucose (carbohydrates) after you eat a meal. What kind of sample is taken? A blood sample is required for this test. It is usually collected by inserting a needle into a blood vessel. How do I prepare for this test? You will need to eat a meal that includes at least 75 grams of carbohydrates. The meal should include foods that contain carbohydrates, such as bread, pasta, potatoes, fruit, or milk. Two hours after eating this meal, a blood sample is taken. Do not do any of the following until after your blood sample is taken: Eat anything else. Eating anything during the testing period can affect test results. You may drink water during the testing period. Drink alcohol. Smoke. Exercise. Take any medicines that will affect your blood glucose. Tell a health care provider about: Any allergies you have. All medicines you are taking, including vitamins, herbs, eye drops, creams, and over-the-counter medicines. Any blood disorders you have. Any surgeries you have had. Any medical conditions you have. Whether you are pregnant or may be  pregnant. Whether you are unable to finish your meal or drink, or you vomit. How are the results reported? Results will be reported as a value that shows how much glucose is in your blood. This may be reported as milligrams per deciliter (mg/dL) or millimoles per liter (mmol/L). Your health care provider will compare your results to normal ranges that were established after testing a large group of people (reference ranges). Reference ranges may vary among labs and hospitals. For this test, a common reference range is less than 140 mg/dL (7.8 mmol/L). If you have diabetes and you are being tested to see how well your diabetes management plan is working, your reference range is less than 180 mg/dL (78.2 mmol/L). What do the results mean? Results within the reference range may mean that you do not have diabetes. Results higher than your reference range may mean that you have: Diabetes. Gestational diabetes, if applicable. Malnutrition. Hyperthyroidism. Acute stress response. Cushing syndrome. Tumors. Kidney failure. Acromegaly. Liver disease. If you are already diagnosed with diabetes: Results within your reference range mean that your diabetes management plan is working well and keeping your blood glucose in the normal range after you eat a meal. Results higher than your reference range may mean that your diabetes management plan is not working well. You may need to work with your health care provider to adjust your plan. Talk with your health care provider about what your results mean. Questions to ask your health care provider Ask your health care provider, or the department that is doing the test: When will my results be ready? How will  I get my results? What are my treatment options? What other tests do I need? What are my next steps? Summary The PPG test may be used to screen for diabetes. If you are already diagnosed with diabetes, your health care provider may recommend that you  have this test to check how well your diabetes management plan is working. This test is done to see how well your body responds to glucose (carbohydrates) after you eat a meal. Talk with your health care provider about what your results mean. This information is not intended to replace advice given to you by your health care provider. Make sure you discuss any questions you have with your health care provider. Document Revised: 06/20/2020 Document Reviewed: 06/20/2020 Elsevier Patient Education  2022 [employer].

## 2021-08-14 NOTE — Progress Notes (Signed)
ROB doing well, feeling good movement. Discussed Glucose screening next visit, handout given with eating instructions prior to test. She verbalizes and agrees. PT states she has some occasional tightness, PTL precautions reviewed. Follow up 2 wk for ROB/glucose screen.   Doreene Burke, CNM

## 2021-09-02 ENCOUNTER — Other Ambulatory Visit: Payer: Medicaid Other

## 2021-09-02 ENCOUNTER — Ambulatory Visit (INDEPENDENT_AMBULATORY_CARE_PROVIDER_SITE_OTHER): Payer: Medicaid Other | Admitting: Certified Nurse Midwife

## 2021-09-02 ENCOUNTER — Other Ambulatory Visit: Payer: Self-pay

## 2021-09-02 ENCOUNTER — Encounter: Payer: Self-pay | Admitting: Certified Nurse Midwife

## 2021-09-02 VITALS — BP 95/66 | HR 84 | Wt 179.5 lb

## 2021-09-02 DIAGNOSIS — Z23 Encounter for immunization: Secondary | ICD-10-CM

## 2021-09-02 DIAGNOSIS — Z3A28 28 weeks gestation of pregnancy: Secondary | ICD-10-CM

## 2021-09-02 DIAGNOSIS — Z3482 Encounter for supervision of other normal pregnancy, second trimester: Secondary | ICD-10-CM

## 2021-09-02 LAB — POCT URINALYSIS DIPSTICK OB
Bilirubin, UA: NEGATIVE
Blood, UA: NEGATIVE
Glucose, UA: NEGATIVE
Ketones, UA: NEGATIVE
Leukocytes, UA: NEGATIVE
Nitrite, UA: NEGATIVE
POC,PROTEIN,UA: NEGATIVE
Spec Grav, UA: 1.01 (ref 1.010–1.025)
Urobilinogen, UA: 0.2 E.U./dL
pH, UA: 7 (ref 5.0–8.0)

## 2021-09-02 MED ORDER — TETANUS-DIPHTH-ACELL PERTUSSIS 5-2.5-18.5 LF-MCG/0.5 IM SUSY
0.5000 mL | PREFILLED_SYRINGE | Freq: Once | INTRAMUSCULAR | Status: AC
  Administered 2021-09-02: 09:00:00 0.5 mL via INTRAMUSCULAR

## 2021-09-02 NOTE — Patient Instructions (Signed)
Oral Glucose Tolerance Test During Pregnancy °Why am I having this test? °The oral glucose tolerance test (OGTT) is done to check how your body processes blood sugar (glucose). This is one of several tests used to diagnose diabetes that develops during pregnancy (gestational diabetes mellitus). Gestational diabetes is a short-term form of diabetes that some women develop while they are pregnant. It usually occurs during the second trimester of pregnancy and goes away after delivery. °Testing, or screening, for gestational diabetes usually occurs at weeks 24-28 of pregnancy. You may have the OGTT test after having a 1-hour glucose screening test if the results from that test indicate that you may have gestational diabetes. This test may also be needed if: °You have a history of gestational diabetes. °There is a history of giving birth to very large babies or of losing pregnancies (having stillbirths). °You have signs and symptoms of diabetes, such as: °Changes in your eyesight. °Tingling or numbness in your hands or feet. °Changes in hunger, thirst, and urination, and these are not explained by your pregnancy. °What is being tested? °This test measures the amount of glucose in your blood at different times during a period of 3 hours. This shows how well your body can process glucose. °What kind of sample is taken? °Blood samples are required for this test. They are usually collected by inserting a needle into a blood vessel. °How do I prepare for this test? °For 3 days before your test, eat normally. Have plenty of carbohydrate-rich foods. °Follow instructions from your health care provider about: °Eating or drinking restrictions on the day of the test. You may be asked not to eat or drink anything other than water (to fast) starting 8-10 hours before the test. °Changing or stopping your regular medicines. Some medicines may interfere with this test. °Tell a health care provider about: °All medicines you are taking,  including vitamins, herbs, eye drops, creams, and over-the-counter medicines. °Any blood disorders you have. °Any surgeries you have had. °Any medical conditions you have. °What happens during the test? °First, your blood glucose will be measured. This is referred to as your fasting blood glucose because you fasted before the test. Then, you will drink a glucose solution that contains a certain amount of glucose. Your blood glucose will be measured again 1, 2, and 3 hours after you drink the solution. °This test takes about 3 hours to complete. You will need to stay at the testing location during this time. During the testing period: °Do not eat or drink anything other than the glucose solution. °Do not exercise. °Do not use any products that contain nicotine or tobacco, such as cigarettes, e-cigarettes, and chewing tobacco. These can affect your test results. If you need help quitting, ask your health care provider. °The testing procedure may vary among health care providers and hospitals. °How are the results reported? °Your results will be reported as milligrams of glucose per deciliter of blood (mg/dL) or millimoles per liter (mmol/L). There is more than one source for screening and diagnosis reference values used to diagnose gestational diabetes. Your health care provider will compare your results to normal values that were established after testing a large group of people (reference values). Reference values may vary among labs and hospitals. For this test (Carpenter-Coustan), reference values are: °Fasting: 95 mg/dL (5.3 mmol/L). °1 hour: 180 mg/dL (10.0 mmol/L). °2 hour: 155 mg/dL (8.6 mmol/L). °3 hour: 140 mg/dL (7.8 mmol/L). °What do the results mean? °Results below the reference values are considered   normal. If two or more of your blood glucose levels are at or above the reference values, you may be diagnosed with gestational diabetes. If only one level is high, your health care provider may suggest  repeat testing or other tests to confirm a diagnosis. °Talk with your health care provider about what your results mean. °Questions to ask your health care provider °Ask your health care provider, or the department that is doing the test: °When will my results be ready? °How will I get my results? °What are my treatment options? °What other tests do I need? °What are my next steps? °Summary °The oral glucose tolerance test (OGTT) is one of several tests used to diagnose diabetes that develops during pregnancy (gestational diabetes mellitus). Gestational diabetes is a short-term form of diabetes that some women develop while they are pregnant. °You may have the OGTT test after having a 1-hour glucose screening test if the results from that test show that you may have gestational diabetes. You may also have this test if you have any symptoms or risk factors for this type of diabetes. °Talk with your health care provider about what your results mean. °This information is not intended to replace advice given to you by your health care provider. Make sure you discuss any questions you have with your health care provider. °Document Revised: 03/01/2020 Document Reviewed: 03/01/2020 °Elsevier Patient Education © 2022 Elsevier Inc. ° °

## 2021-09-02 NOTE — Progress Notes (Signed)
ROB doing well. Feels good movement. 28 wk labs today: Glucose screen/RPR/CBC. Tdap, Blood transfusion consent completed, all questions answered. Ready set baby reviewed, see check list for topics covered. Sample birth plan given, will follow up in upcoming visits. Discussed birth control after delivery, information pamphlet given.   Follow up 1 wk  for ROB or sooner if needed.    Doreene Burke, CNM

## 2021-09-03 ENCOUNTER — Other Ambulatory Visit: Payer: Self-pay | Admitting: Certified Nurse Midwife

## 2021-09-03 ENCOUNTER — Encounter: Payer: Self-pay | Admitting: Certified Nurse Midwife

## 2021-09-03 LAB — CBC
Hematocrit: 30.8 % — ABNORMAL LOW (ref 34.0–46.6)
Hemoglobin: 9.8 g/dL — ABNORMAL LOW (ref 11.1–15.9)
MCH: 26.6 pg (ref 26.6–33.0)
MCHC: 31.8 g/dL (ref 31.5–35.7)
MCV: 84 fL (ref 79–97)
Platelets: 217 10*3/uL (ref 150–450)
RBC: 3.68 x10E6/uL — ABNORMAL LOW (ref 3.77–5.28)
RDW: 15.5 % — ABNORMAL HIGH (ref 11.7–15.4)
WBC: 8.5 10*3/uL (ref 3.4–10.8)

## 2021-09-03 LAB — RPR: RPR Ser Ql: NONREACTIVE

## 2021-09-03 LAB — GLUCOSE, 1 HOUR GESTATIONAL: Gestational Diabetes Screen: 101 mg/dL (ref 70–139)

## 2021-09-03 MED ORDER — FUSION PLUS PO CAPS: 1.0000 | ORAL_CAPSULE | Freq: Every day | ORAL | 9 refills | Status: DC

## 2021-09-09 ENCOUNTER — Encounter: Payer: Self-pay | Admitting: Certified Nurse Midwife

## 2021-09-09 ENCOUNTER — Ambulatory Visit (INDEPENDENT_AMBULATORY_CARE_PROVIDER_SITE_OTHER): Payer: Medicaid Other | Admitting: Certified Nurse Midwife

## 2021-09-09 ENCOUNTER — Other Ambulatory Visit: Payer: Self-pay

## 2021-09-09 VITALS — BP 104/68 | HR 86 | Wt 183.7 lb

## 2021-09-09 DIAGNOSIS — Z3483 Encounter for supervision of other normal pregnancy, third trimester: Secondary | ICD-10-CM

## 2021-09-09 DIAGNOSIS — Z3A30 30 weeks gestation of pregnancy: Secondary | ICD-10-CM

## 2021-09-09 LAB — POCT URINALYSIS DIPSTICK OB
Bilirubin, UA: NEGATIVE
Blood, UA: NEGATIVE
Glucose, UA: NEGATIVE
Ketones, UA: NEGATIVE
Leukocytes, UA: NEGATIVE
Nitrite, UA: NEGATIVE
POC,PROTEIN,UA: NEGATIVE
Spec Grav, UA: 1.01 (ref 1.010–1.025)
Urobilinogen, UA: 0.2 E.U./dL
pH, UA: 6.5 (ref 5.0–8.0)

## 2021-09-09 NOTE — Patient Instructions (Signed)
[place] [facility]  [facility]  [address], [place], [place] [address]  Phone: [phone]  [facility] (second location)  [address] [place], [place] [address]  Phone: [phone]  [facility] ([place]) [address], [place], [place] [address] Phone: [phone]  [facility]  [address] [place], [place] [address]  Phone: [phone] 

## 2021-09-09 NOTE — Progress Notes (Signed)
ROB doing well, feeling good movement. Reviewed birth plan today. Breastfeeding and Epidural. Copy scanned to chart. Discussed TOLAC counseling visit with MD in 2 wks she verbalizes and agrees.  Doreene Burke, CNM

## 2021-09-23 ENCOUNTER — Encounter: Payer: Medicaid Other | Admitting: Obstetrics and Gynecology

## 2021-10-03 ENCOUNTER — Encounter: Payer: Medicaid Other | Admitting: Obstetrics and Gynecology

## 2021-10-06 NOTE — L&D Delivery Note (Addendum)
° ° °     Delivery Note   Ludell Zacarias is a 21 y.o. [id number] at [redacted]w[redacted]d Estimated Date of Delivery: 11/16/21  PRE-OPERATIVE DIAGNOSIS:  1) [redacted]w[redacted]d pregnancy.    POST-OPERATIVE DIAGNOSIS:  1) [redacted]w[redacted]d pregnancy s/p VBAC, Spontaneous   Delivery Type: VBAC, Spontaneous    Delivery Anesthesia: Epidural;Local   Labor Complications:  none    ESTIMATED BLOOD LOSS: 660 ml    FINDINGS:   1) female infant, Apgar scores of 8   at 1 minute and 9   at 5 minutes and a birthweight is pending, infant skin to skin.    2) Nuchal cord: no SPECIMENS:   PLACENTA:   Appearance: Intact , 3 vessel cord, cord blood collected   Removal: Spontaneous      Disposition:  per protocol   DISPOSITION:  Infant to left in stable condition in the delivery room, with L&D personnel and mother,  NARRATIVE SUMMARY: Labor course:  Ms. Jereline Ticer is a [id number] at [redacted]w[redacted]d who presented for induction of labor.  She progressed well in labor with pitocin.  She received the appropriate epidural anesthesia and proceeded to complete dilation. She evidenced good maternal expulsive effort during the second stage. She went on to deliver a viable female infant. The placenta delivered without problems and was noted to be complete. A perineal and vaginal examination was performed. Lacerations: 2nd degree;Vaginal;Perineal . The lacerations was repaired with 3-0 Vicryl Rapide suture using local anesthesia. There continued to be trickle of blood at repair site.Additional stitches placed which cause more bleeding. Sterile gauze used to apply pressure. Will continue to monitor. PT continued to have bleeding, 2 additional stiches placed. Consulted Dr. Logan Bores. He came to bedside and evaluated. Vaginal packing placed to leave for several hours. The patient tolerated this well.  Doreene Burke, CNM  11/22/2021 4:56 PM

## 2021-10-16 ENCOUNTER — Encounter: Payer: Self-pay | Admitting: Obstetrics and Gynecology

## 2021-10-16 ENCOUNTER — Other Ambulatory Visit: Payer: Self-pay

## 2021-10-16 ENCOUNTER — Ambulatory Visit (INDEPENDENT_AMBULATORY_CARE_PROVIDER_SITE_OTHER): Payer: Medicaid Other | Admitting: Obstetrics and Gynecology

## 2021-10-16 VITALS — BP 92/55 | HR 96 | Wt 187.6 lb

## 2021-10-16 DIAGNOSIS — Z3A35 35 weeks gestation of pregnancy: Secondary | ICD-10-CM

## 2021-10-16 DIAGNOSIS — Z3483 Encounter for supervision of other normal pregnancy, third trimester: Secondary | ICD-10-CM

## 2021-10-16 LAB — POCT URINALYSIS DIPSTICK OB
Bilirubin, UA: NEGATIVE
Blood, UA: NEGATIVE
Glucose, UA: NEGATIVE
Ketones, UA: NEGATIVE
Nitrite, UA: NEGATIVE
Spec Grav, UA: 1.01 (ref 1.010–1.025)
Urobilinogen, UA: 0.2 E.U./dL
pH, UA: 7.5 (ref 5.0–8.0)

## 2021-10-16 NOTE — Progress Notes (Signed)
ROB: She presents today for discussion regarding TOLAC.  Her last pregnancy was spontaneous labor and after she received an epidural the baby had heart rate changes which required cesarean delivery.  Baby was not macrosomic.  We have discussed the risks and benefits of TOLAC versus repeat cesarean delivery.  All questions were answered.  I see no current contraindications to attempting vaginal birth if patient desires.  Cultures next visit

## 2021-10-16 NOTE — Progress Notes (Signed)
ROB: She is doing well, no new concerns. 

## 2021-10-24 ENCOUNTER — Encounter: Payer: Medicaid Other | Admitting: Obstetrics

## 2021-10-24 DIAGNOSIS — Z3A36 36 weeks gestation of pregnancy: Secondary | ICD-10-CM

## 2021-10-24 DIAGNOSIS — Z3483 Encounter for supervision of other normal pregnancy, third trimester: Secondary | ICD-10-CM

## 2021-10-25 ENCOUNTER — Other Ambulatory Visit: Payer: Self-pay

## 2021-10-25 ENCOUNTER — Ambulatory Visit (INDEPENDENT_AMBULATORY_CARE_PROVIDER_SITE_OTHER): Payer: Medicaid Other | Admitting: Obstetrics

## 2021-10-25 VITALS — BP 108/69 | HR 102 | Wt 192.9 lb

## 2021-10-25 DIAGNOSIS — Z113 Encounter for screening for infections with a predominantly sexual mode of transmission: Secondary | ICD-10-CM

## 2021-10-25 DIAGNOSIS — Z3A36 36 weeks gestation of pregnancy: Secondary | ICD-10-CM

## 2021-10-25 DIAGNOSIS — Z3483 Encounter for supervision of other normal pregnancy, third trimester: Secondary | ICD-10-CM

## 2021-10-25 LAB — POCT URINALYSIS DIPSTICK OB
Bilirubin, UA: NEGATIVE
Blood, UA: NEGATIVE
Glucose, UA: NEGATIVE
Ketones, UA: NEGATIVE
Leukocytes, UA: NEGATIVE
Nitrite, UA: NEGATIVE
POC,PROTEIN,UA: NEGATIVE
Spec Grav, UA: 1.02 (ref 1.010–1.025)
Urobilinogen, UA: 0.2 E.U./dL
pH, UA: 7.5 (ref 5.0–8.0)

## 2021-10-25 NOTE — Progress Notes (Signed)
ROB at [redacted]w[redacted]d. Good fetal movement. Feels well. Getting ready for baby at home. Has car seat. Deciding on pediatrician; handout given. Reviewed s/s of labor and when to call. GBS and GC/chlamydia collected today. RTC in one week.  Lloyd Huger, CNM

## 2021-10-29 ENCOUNTER — Encounter: Payer: Self-pay | Admitting: Obstetrics

## 2021-10-29 LAB — GC/CHLAMYDIA PROBE AMP
Chlamydia trachomatis, NAA: NEGATIVE
Neisseria Gonorrhoeae by PCR: NEGATIVE

## 2021-10-29 LAB — CULTURE, BETA STREP (GROUP B ONLY): Strep Gp B Culture: NEGATIVE

## 2021-11-04 ENCOUNTER — Encounter: Payer: Medicaid Other | Admitting: Certified Nurse Midwife

## 2021-11-05 ENCOUNTER — Other Ambulatory Visit: Payer: Self-pay

## 2021-11-05 ENCOUNTER — Other Ambulatory Visit (HOSPITAL_COMMUNITY)
Admission: RE | Admit: 2021-11-05 | Discharge: 2021-11-05 | Disposition: A | Discharge status: Home / Self Care | Payer: Self-pay | Source: Ambulatory Visit | Attending: Certified Nurse Midwife | Admitting: Certified Nurse Midwife

## 2021-11-05 ENCOUNTER — Ambulatory Visit (INDEPENDENT_AMBULATORY_CARE_PROVIDER_SITE_OTHER): Payer: Medicaid Other | Admitting: Certified Nurse Midwife

## 2021-11-05 VITALS — BP 110/73 | HR 109 | Wt 193.4 lb

## 2021-11-05 DIAGNOSIS — O26893 Other specified pregnancy related conditions, third trimester: Secondary | ICD-10-CM

## 2021-11-05 DIAGNOSIS — Z3A38 38 weeks gestation of pregnancy: Secondary | ICD-10-CM

## 2021-11-05 DIAGNOSIS — N898 Other specified noninflammatory disorders of vagina: Secondary | ICD-10-CM | POA: Insufficient documentation

## 2021-11-05 DIAGNOSIS — Z3483 Encounter for supervision of other normal pregnancy, third trimester: Secondary | ICD-10-CM

## 2021-11-05 LAB — POCT URINALYSIS DIPSTICK OB
Bilirubin, UA: NEGATIVE
Blood, UA: NEGATIVE
Glucose, UA: NEGATIVE
Ketones, UA: NEGATIVE
Leukocytes, UA: NEGATIVE
Nitrite, UA: NEGATIVE
POC,PROTEIN,UA: NEGATIVE
Spec Grav, UA: 1.01 (ref 1.010–1.025)
Urobilinogen, UA: 0.2 E.U./dL
pH, UA: 7 (ref 5.0–8.0)

## 2021-11-05 NOTE — Patient Instructions (Signed)
Braxton Hicks Contractions Contractions of the uterus can occur throughout pregnancy, but they are not always a sign that you are in labor. You may have practice contractions called Braxton Hicks contractions. These false labor contractions are sometimes confused with true labor. What are Braxton Hicks contractions? Braxton Hicks contractions are tightening movements that occur in the muscles of the uterus before labor. Unlike true labor contractions, these contractions do not result in opening (dilation) and thinning of the lowest part of the uterus (cervix). Toward the end of pregnancy (32-34 weeks), Braxton Hicks contractions can happen more often and may become stronger. These contractions are sometimes difficult to tell apart from true labor because they can be very uncomfortable. How to tell the difference between true labor and false labor True labor Contractions last 30-70 seconds. Contractions become very regular. Discomfort is usually felt in the top of the uterus, and it spreads to the lower abdomen and low back. Contractions do not go away with walking. Contractions usually become stronger and more frequent. The cervix dilates and gets thinner. False labor Contractions are usually shorter, weaker, and farther apart than true labor contractions. Contractions are usually irregular. Contractions are often felt in the front of the lower abdomen and in the groin. Contractions may go away when you walk around or change positions while lying down. The cervix usually does not dilate or become thin. Sometimes, the only way to tell if you are in true labor is for your health care provider to look for changes in your cervix. Your health care provider will do a physical exam and may monitor your contractions. If you are in true labor, your health care provider will send you home with instructions about when to return to the hospital. You may continue to have Braxton Hicks contractions until you  go into true labor. Follow these instructions at home:  Take over-the-counter and prescription medicines only as told by your health care provider. If Braxton Hicks contractions are making you uncomfortable: Change your position from lying down or resting to walking, or change from walking to resting. Sit and rest in a tub of warm water. Drink enough fluid to keep your urine pale yellow. Dehydration may cause these contractions. Do slow and deep breathing several times an hour. Keep all follow-up visits. This is important. Contact a health care provider if: You have a fever. You have continuous pain in your abdomen. Your contractions become stronger, more regular, and closer together. You pass blood-tinged mucus. Get help right away if: You have fluid leaking or gushing from your vagina. You have bright red blood coming from your vagina. Your baby is not moving inside you as much as it used to. Summary You may have practice contractions called Braxton Hicks contractions. These false labor contractions are sometimes confused with true labor. Braxton Hicks contractions are usually shorter, weaker, farther apart, and less regular than true labor contractions. True labor contractions usually become stronger, more regular, and more frequent. Manage discomfort from Braxton Hicks contractions by changing position, resting in a warm bath, practicing deep breathing, and drinking plenty of water. Keep all follow-up visits. Contact your health care provider if your contractions become stronger, more regular, and closer together. This information is not intended to replace advice given to you by your health care provider. Make sure you discuss any questions you have with your health care provider. Document Revised: 07/30/2020 Document Reviewed: 07/30/2020 Elsevier Patient Education  2022 [employer] Inc.  

## 2021-11-05 NOTE — Progress Notes (Signed)
ROB doing well. Feels good movement. Labor precautions reviewed. Pt c/o vaginal discharge, swab collected. SVE per pt request 2/50/-2 station. Follow up 1 wk with Missy for ROB.   Philip Aspen, CNM

## 2021-11-07 LAB — CERVICOVAGINAL ANCILLARY ONLY
Bacterial Vaginitis (gardnerella): NEGATIVE
Candida Glabrata: NEGATIVE
Candida Vaginitis: NEGATIVE
Comment: NEGATIVE
Comment: NEGATIVE
Comment: NEGATIVE

## 2021-11-13 ENCOUNTER — Ambulatory Visit (INDEPENDENT_AMBULATORY_CARE_PROVIDER_SITE_OTHER): Payer: Medicaid Other | Admitting: Obstetrics

## 2021-11-13 ENCOUNTER — Other Ambulatory Visit: Payer: Self-pay

## 2021-11-13 ENCOUNTER — Encounter: Payer: Self-pay | Admitting: Obstetrics

## 2021-11-13 VITALS — BP 108/72 | Wt 190.0 lb

## 2021-11-13 DIAGNOSIS — Z3483 Encounter for supervision of other normal pregnancy, third trimester: Secondary | ICD-10-CM

## 2021-11-13 NOTE — Progress Notes (Signed)
No vb. No lof pt wants cervical check today.

## 2021-11-13 NOTE — Progress Notes (Signed)
ROB at [redacted]w[redacted]d. Reports decreased fetal movement. RNST (see note). Has had a few runs of contractions. Knows when to go to the hospital. Discussed postdates testing. Would like IOL scheduled by 41 weeks. Scheduled for 2/17 @ 0500 (107w6d). ROB/NST in one week.  Guadlupe Spanish, CNM

## 2021-11-16 ENCOUNTER — Inpatient Hospital Stay: Admit: 2021-11-16 | Payer: Self-pay

## 2021-11-18 ENCOUNTER — Ambulatory Visit (INDEPENDENT_AMBULATORY_CARE_PROVIDER_SITE_OTHER): Payer: Medicaid Other | Admitting: Certified Nurse Midwife

## 2021-11-18 ENCOUNTER — Other Ambulatory Visit: Payer: Self-pay

## 2021-11-18 ENCOUNTER — Encounter: Payer: Self-pay | Admitting: Certified Nurse Midwife

## 2021-11-18 ENCOUNTER — Other Ambulatory Visit: Payer: Medicaid Other | Admitting: Obstetrics and Gynecology

## 2021-11-18 VITALS — BP 109/71 | HR 87 | Wt 197.9 lb

## 2021-11-18 DIAGNOSIS — Z3A40 40 weeks gestation of pregnancy: Secondary | ICD-10-CM

## 2021-11-18 DIAGNOSIS — Z3483 Encounter for supervision of other normal pregnancy, third trimester: Secondary | ICD-10-CM | POA: Diagnosis not present

## 2021-11-18 LAB — POCT URINALYSIS DIPSTICK OB
Bilirubin, UA: NEGATIVE
Blood, UA: NEGATIVE
Glucose, UA: NEGATIVE
Ketones, UA: NEGATIVE
Leukocytes, UA: NEGATIVE
Nitrite, UA: NEGATIVE
POC,PROTEIN,UA: NEGATIVE
Spec Grav, UA: 1.015 (ref 1.010–1.025)
Urobilinogen, UA: 0.2 E.U./dL
pH, UA: 7 (ref 5.0–8.0)

## 2021-11-18 NOTE — Patient Instructions (Signed)
Labor Induction ?Labor induction is when steps are taken to cause a pregnant woman to begin the labor process. Most women go into labor on their own between 37 weeks and 42 weeks of pregnancy. When this does not happen, or when there is a medical need for labor to begin, steps may be taken to induce, or bring on, labor. ?Labor induction causes a pregnant woman's uterus to contract. It also causes the cervix to soften (ripen), open (dilate), and thin out. Usually, labor is not induced before 39 weeks of pregnancy unless there is a medical reason to do so. ?When is labor induction considered? ?Labor induction may be right for you if: ?Your pregnancy lasts longer than 41 to 42 weeks. ?Your placenta is separating from your uterus (placental abruption). ?You have a rupture of membranes and your labor does not begin. ?You have health problems, like diabetes or high blood pressure (preeclampsia) during your pregnancy. ?Your baby has stopped growing or does not have enough amniotic fluid. ?Before labor induction begins, your health care provider will consider the following factors: ?Your medical condition and the baby's condition. ?How many weeks you have been pregnant. ?How mature the baby's lungs are. ?The condition of your cervix. ?The position of the baby. ?The size of your birth canal. ?Tell a health care provider about: ?Any allergies you have. ?All medicines you are taking, including vitamins, herbs, eye drops, creams, and over-the-counter medicines. ?Any problems you or your family members have had with anesthetic medicines. ?Any surgeries you have had. ?Any blood disorders you have. ?Any medical conditions you have. ?What are the risks? ?Generally, this is a safe procedure. However, problems may occur, including: ?Failed induction. ?Changes in fetal heart rate, such as being too high, too low, or irregular (erratic). ?Infection in the mother or the baby. ?Increased risk of having a cesarean delivery. ?Breaking off  (abruption) of the placenta from the uterus. This is rare. ?Rupture of the uterus. This is very rare. ?Your baby could fail to get enough blood flow or oxygen. This can be life-threatening. ?When induction is needed for medical reasons, the benefits generally outweigh the risks. ?What happens during the procedure? ?During the procedure, your health care provider will use one of these methods to induce labor: ?Stripping the membranes. In this method, the amniotic sac tissue is gently separated from the cervix. This causes the following to happen: ?Your cervix stretches, which in turn causes the release of prostaglandins. ?Prostaglandins induce labor and cause the uterus to contract. ?This procedure is often done in an office visit. You will be sent home to wait for contractions to begin. ?Prostaglandin medicine. This medicine starts contractions and causes the cervix to dilate and ripen. This can be taken by mouth (orally) or by being inserted into the vagina (suppository). ?Inserting a small, thin tube (catheter) with a balloon into the vagina and then expanding the balloon with water to dilate the cervix. ?Breaking the water. In this method, a small instrument is used to make a small hole in the amniotic sac. This eventually causes the amniotic sac to break. Contractions should begin within a few hours. ?Medicine to trigger or strengthen contractions. This medicine is given through an IV that is inserted into a vein in your arm. ?This procedure may vary among health care providers and hospitals. ?Where to find more information ?March of Dimes: www.marchofdimes.org ?The American College of Obstetricians and Gynecologists: www.acog.org ?Summary ?Labor induction causes a pregnant woman's uterus to contract. It also causes the cervix   to soften (ripen), open (dilate), and thin out. ?Labor is usually not induced before 39 weeks of pregnancy unless there is a medical reason to do so. ?When induction is needed for medical  reasons, the benefits generally outweigh the risks. ?Talk with your health care provider about which methods of labor induction are right for you. ?This information is not intended to replace advice given to you by your health care provider. Make sure you discuss any questions you have with your health care provider. ?Document Revised: 07/05/2020 Document Reviewed: 07/05/2020 ?Elsevier Patient Education ? 2022 Elsevier Inc. ? ?

## 2021-11-18 NOTE — Progress Notes (Signed)
ROB and NST for [redacted] wks gestation. Feeling good movement. Discussed covid testing on Wednesday. Discussed method of induction. SVE 3-4 /80/-2 , membranes striped per pt request.  NST Reactive, category 1 strip .   Baseline 135  Accelerations present Decelerations absent Moderate variability  Ctx occasional.   Follow up as scheduled for induction on Friday @ 0500.  Philip Aspen, CNM

## 2021-11-20 ENCOUNTER — Other Ambulatory Visit: Payer: Self-pay

## 2021-11-20 ENCOUNTER — Other Ambulatory Visit
Admission: RE | Admit: 2021-11-20 | Discharge: 2021-11-20 | Disposition: A | Discharge status: Home / Self Care | Payer: Medicaid Other | Source: Ambulatory Visit | Attending: Certified Nurse Midwife | Admitting: Certified Nurse Midwife

## 2021-11-20 DIAGNOSIS — Z20822 Contact with and (suspected) exposure to covid-19: Secondary | ICD-10-CM | POA: Insufficient documentation

## 2021-11-20 DIAGNOSIS — Z01812 Encounter for preprocedural laboratory examination: Secondary | ICD-10-CM | POA: Diagnosis not present

## 2021-11-20 LAB — SARS CORONAVIRUS 2 (TAT 6-24 HRS): SARS Coronavirus 2: NEGATIVE

## 2021-11-22 ENCOUNTER — Other Ambulatory Visit: Payer: Self-pay

## 2021-11-22 ENCOUNTER — Inpatient Hospital Stay: Payer: Medicaid Other | Admitting: Anesthesiology

## 2021-11-22 ENCOUNTER — Encounter: Payer: Self-pay | Admitting: Certified Nurse Midwife

## 2021-11-22 ENCOUNTER — Inpatient Hospital Stay
Admission: EM | Admit: 2021-11-22 | Discharge: 2021-11-23 | DRG: 807 | Disposition: A | Discharge status: Home / Self Care | Payer: Medicaid Other | Attending: Certified Nurse Midwife | Admitting: Certified Nurse Midwife

## 2021-11-22 DIAGNOSIS — Z3A40 40 weeks gestation of pregnancy: Secondary | ICD-10-CM

## 2021-11-22 DIAGNOSIS — J45909 Unspecified asthma, uncomplicated: Secondary | ICD-10-CM | POA: Diagnosis present

## 2021-11-22 DIAGNOSIS — O34219 Maternal care for unspecified type scar from previous cesarean delivery: Secondary | ICD-10-CM | POA: Diagnosis present

## 2021-11-22 DIAGNOSIS — O480 Post-term pregnancy: Secondary | ICD-10-CM | POA: Diagnosis present

## 2021-11-22 DIAGNOSIS — O9952 Diseases of the respiratory system complicating childbirth: Secondary | ICD-10-CM | POA: Diagnosis present

## 2021-11-22 HISTORY — DX: Anemia, unspecified: D64.9

## 2021-11-22 LAB — URINE DRUG SCREEN, QUALITATIVE (ARMC ONLY)
Amphetamines, Ur Screen: NOT DETECTED
Barbiturates, Ur Screen: NOT DETECTED
Benzodiazepine, Ur Scrn: NOT DETECTED
Cannabinoid 50 Ng, Ur ~~LOC~~: NOT DETECTED
Cocaine Metabolite,Ur ~~LOC~~: NOT DETECTED
MDMA (Ecstasy)Ur Screen: NOT DETECTED
Methadone Scn, Ur: NOT DETECTED
Opiate, Ur Screen: NOT DETECTED
Phencyclidine (PCP) Ur S: NOT DETECTED
Tricyclic, Ur Screen: NOT DETECTED

## 2021-11-22 LAB — CBC
HCT: 36 % (ref 36.0–46.0)
Hemoglobin: 11.4 g/dL — ABNORMAL LOW (ref 12.0–15.0)
MCH: 27.5 pg (ref 26.0–34.0)
MCHC: 31.7 g/dL (ref 30.0–36.0)
MCV: 87 fL (ref 80.0–100.0)
Platelets: 146 10*3/uL — ABNORMAL LOW (ref 150–400)
RBC: 4.14 MIL/uL (ref 3.87–5.11)
RDW: 16.6 % — ABNORMAL HIGH (ref 11.5–15.5)
WBC: 7.9 10*3/uL (ref 4.0–10.5)
nRBC: 0 % (ref 0.0–0.2)

## 2021-11-22 LAB — ABO/RH: ABO/RH(D): O POS

## 2021-11-22 LAB — TYPE AND SCREEN
ABO/RH(D): O POS
Antibody Screen: NEGATIVE

## 2021-11-22 LAB — RPR: RPR Ser Ql: NONREACTIVE

## 2021-11-22 MED ORDER — FERRIC SUBSULFATE 259 MG/GM EX SOLN
CUTANEOUS | Status: AC
Start: 2021-11-22 — End: 2021-11-23
  Filled 2021-11-22: qty 8

## 2021-11-22 MED ORDER — ONDANSETRON HCL 4 MG/2ML IJ SOLN: 4.0000 mg | Freq: Four times a day (QID) | INTRAMUSCULAR | Status: DC | PRN

## 2021-11-22 MED ORDER — METHYLERGONOVINE MALEATE 0.2 MG/ML IJ SOLN
0.2000 mg | INTRAMUSCULAR | Status: DC | PRN
  Filled 2021-11-22: qty 1

## 2021-11-22 MED ORDER — DIPHENHYDRAMINE HCL 50 MG/ML IJ SOLN: 12.5000 mg | INTRAMUSCULAR | Status: DC | PRN

## 2021-11-22 MED ORDER — FENTANYL-BUPIVACAINE-NACL 0.5-0.125-0.9 MG/250ML-% EP SOLN
EPIDURAL | Status: DC | PRN
Start: 2021-11-22 — End: 2021-11-22
  Administered 2021-11-22: 12 mL/h via EPIDURAL

## 2021-11-22 MED ORDER — PHENYLEPHRINE 40 MCG/ML (10ML) SYRINGE FOR IV PUSH (FOR BLOOD PRESSURE SUPPORT)
80.0000 ug | PREFILLED_SYRINGE | INTRAVENOUS | Status: DC | PRN
  Filled 2021-11-22: qty 10

## 2021-11-22 MED ORDER — METHYLERGONOVINE MALEATE 0.2 MG PO TABS
0.2000 mg | ORAL_TABLET | ORAL | Status: DC | PRN
  Filled 2021-11-22: qty 1

## 2021-11-22 MED ORDER — LACTATED RINGERS IV SOLN
500.0000 mL | Freq: Once | INTRAVENOUS | Status: AC
  Administered 2021-11-22: 500 mL via INTRAVENOUS

## 2021-11-22 MED ORDER — TRANEXAMIC ACID-NACL 1000-0.7 MG/100ML-% IV SOLN
INTRAVENOUS | Status: AC
  Filled 2021-11-22: qty 100

## 2021-11-22 MED ORDER — EPHEDRINE 5 MG/ML INJ
10.0000 mg | INTRAVENOUS | Status: DC | PRN
  Filled 2021-11-22: qty 2

## 2021-11-22 MED ORDER — BENZOCAINE-MENTHOL 20-0.5 % EX AERO
1.0000 "application " | INHALATION_SPRAY | CUTANEOUS | Status: DC | PRN
  Filled 2021-11-22: qty 56

## 2021-11-22 MED ORDER — METHYLERGONOVINE MALEATE 0.2 MG/ML IJ SOLN: 0.2000 mg | Freq: Once | INTRAMUSCULAR | Status: AC

## 2021-11-22 MED ORDER — SIMETHICONE 80 MG PO CHEW: 80.0000 mg | CHEWABLE_TABLET | ORAL | Status: DC | PRN

## 2021-11-22 MED ORDER — DIBUCAINE (PERIANAL) 1 % EX OINT: 1.0000 "application " | TOPICAL_OINTMENT | CUTANEOUS | Status: DC | PRN

## 2021-11-22 MED ORDER — LACTATED RINGERS IV SOLN
500.0000 mL | INTRAVENOUS | Status: DC | PRN
  Administered 2021-11-22: 500 mL via INTRAVENOUS

## 2021-11-22 MED ORDER — SENNOSIDES-DOCUSATE SODIUM 8.6-50 MG PO TABS
2.0000 | ORAL_TABLET | ORAL | Status: DC
  Administered 2021-11-23: 2 via ORAL
  Filled 2021-11-22: qty 2

## 2021-11-22 MED ORDER — PRENATAL MULTIVITAMIN CH
1.0000 | ORAL_TABLET | Freq: Every day | ORAL | Status: DC
  Administered 2021-11-23: 1 via ORAL
  Filled 2021-11-22: qty 1

## 2021-11-22 MED ORDER — OXYCODONE-ACETAMINOPHEN 5-325 MG PO TABS: 2.0000 | ORAL_TABLET | ORAL | Status: DC | PRN

## 2021-11-22 MED ORDER — LACTATED RINGERS IV SOLN: INTRAVENOUS | Status: DC

## 2021-11-22 MED ORDER — LIDOCAINE HCL (PF) 1 % IJ SOLN
INTRAMUSCULAR | Status: AC
  Administered 2021-11-22: 30 mL via SUBCUTANEOUS
  Filled 2021-11-22: qty 30

## 2021-11-22 MED ORDER — TRANEXAMIC ACID-NACL 1000-0.7 MG/100ML-% IV SOLN
1000.0000 mg | INTRAVENOUS | Status: AC
  Administered 2021-11-22: 1000 mg via INTRAVENOUS

## 2021-11-22 MED ORDER — BUTORPHANOL TARTRATE 1 MG/ML IJ SOLN
2.0000 mg | Freq: Once | INTRAMUSCULAR | Status: AC
  Administered 2021-11-22: 2 mg via INTRAVENOUS
  Filled 2021-11-22: qty 2

## 2021-11-22 MED ORDER — OXYTOCIN-SODIUM CHLORIDE 30-0.9 UT/500ML-% IV SOLN
1.0000 m[IU]/min | INTRAVENOUS | Status: DC
  Administered 2021-11-22: 2 m[IU]/min via INTRAVENOUS

## 2021-11-22 MED ORDER — OXYTOCIN-SODIUM CHLORIDE 30-0.9 UT/500ML-% IV SOLN
2.5000 [IU]/h | INTRAVENOUS | Status: DC
  Administered 2021-11-22 (×2): 2.5 [IU]/h via INTRAVENOUS
  Filled 2021-11-22 (×2): qty 500

## 2021-11-22 MED ORDER — LIDOCAINE HCL 1 % IJ SOLN
30.0000 mL | Freq: Once | INTRAMUSCULAR | Status: DC
  Filled 2021-11-22: qty 30

## 2021-11-22 MED ORDER — OXYTOCIN-SODIUM CHLORIDE 30-0.9 UT/500ML-% IV SOLN
1.0000 m[IU]/min | INTRAVENOUS | Status: DC
  Filled 2021-11-22: qty 500

## 2021-11-22 MED ORDER — ONDANSETRON HCL 4 MG/2ML IJ SOLN
4.0000 mg | INTRAMUSCULAR | Status: DC | PRN
Start: 2021-11-22 — End: 2021-11-24

## 2021-11-22 MED ORDER — METHYLERGONOVINE MALEATE 0.2 MG/ML IJ SOLN
INTRAMUSCULAR | Status: AC
  Administered 2021-11-22: 0.2 mg via INTRAMUSCULAR
  Filled 2021-11-22: qty 1

## 2021-11-22 MED ORDER — FERROUS SULFATE 325 (65 FE) MG PO TABS
325.0000 mg | ORAL_TABLET | Freq: Every day | ORAL | Status: DC
  Administered 2021-11-23: 325 mg via ORAL
  Filled 2021-11-22: qty 1

## 2021-11-22 MED ORDER — BUPIVACAINE HCL (PF) 0.25 % IJ SOLN
INTRAMUSCULAR | Status: DC | PRN
  Administered 2021-11-22 (×2): 3 mL via EPIDURAL

## 2021-11-22 MED ORDER — BUTORPHANOL TARTRATE 1 MG/ML IJ SOLN: 1.0000 mg | INTRAMUSCULAR | Status: DC | PRN

## 2021-11-22 MED ORDER — FENTANYL-BUPIVACAINE-NACL 0.5-0.125-0.9 MG/250ML-% EP SOLN
EPIDURAL | Status: AC
  Filled 2021-11-22: qty 250

## 2021-11-22 MED ORDER — MISOPROSTOL 200 MCG PO TABS
ORAL_TABLET | ORAL | Status: AC
  Filled 2021-11-22: qty 4

## 2021-11-22 MED ORDER — LIDOCAINE HCL (PF) 1 % IJ SOLN: 30.0000 mL | INTRAMUSCULAR | Status: AC | PRN

## 2021-11-22 MED ORDER — LIDOCAINE HCL (PF) 1 % IJ SOLN
INTRAMUSCULAR | Status: AC
  Filled 2021-11-22: qty 30

## 2021-11-22 MED ORDER — ACETAMINOPHEN 325 MG PO TABS
650.0000 mg | ORAL_TABLET | ORAL | Status: DC | PRN
  Administered 2021-11-22 – 2021-11-23 (×2): 650 mg via ORAL
  Filled 2021-11-22 (×2): qty 2

## 2021-11-22 MED ORDER — WITCH HAZEL-GLYCERIN EX PADS: 1.0000 "application " | MEDICATED_PAD | CUTANEOUS | Status: DC | PRN

## 2021-11-22 MED ORDER — ONDANSETRON HCL 4 MG PO TABS: 4.0000 mg | ORAL_TABLET | ORAL | Status: DC | PRN

## 2021-11-22 MED ORDER — DOCUSATE SODIUM 100 MG PO CAPS
100.0000 mg | ORAL_CAPSULE | Freq: Two times a day (BID) | ORAL | Status: DC
  Administered 2021-11-23: 100 mg via ORAL
  Filled 2021-11-22: qty 1

## 2021-11-22 MED ORDER — LIDOCAINE HCL (PF) 1 % IJ SOLN
INTRAMUSCULAR | Status: DC | PRN
  Administered 2021-11-22: 3 mL

## 2021-11-22 MED ORDER — FENTANYL-BUPIVACAINE-NACL 0.5-0.125-0.9 MG/250ML-% EP SOLN: 12.0000 mL/h | EPIDURAL | Status: DC | PRN

## 2021-11-22 MED ORDER — COCONUT OIL OIL
1.0000 "application " | TOPICAL_OIL | Status: DC | PRN
  Filled 2021-11-22: qty 120

## 2021-11-22 MED ORDER — OXYTOCIN BOLUS FROM INFUSION
333.0000 mL | Freq: Once | INTRAVENOUS | Status: AC
  Administered 2021-11-22: 333 mL via INTRAVENOUS

## 2021-11-22 MED ORDER — OXYTOCIN 10 UNIT/ML IJ SOLN
INTRAMUSCULAR | Status: AC
  Filled 2021-11-22: qty 2

## 2021-11-22 MED ORDER — AMMONIA AROMATIC IN INHA
RESPIRATORY_TRACT | Status: AC
  Filled 2021-11-22: qty 10

## 2021-11-22 MED ORDER — OXYCODONE-ACETAMINOPHEN 5-325 MG PO TABS: 1.0000 | ORAL_TABLET | ORAL | Status: DC | PRN

## 2021-11-22 MED ORDER — LIDOCAINE-EPINEPHRINE (PF) 1.5 %-1:200000 IJ SOLN
INTRAMUSCULAR | Status: DC | PRN
  Administered 2021-11-22: 3 mL via PERINEURAL

## 2021-11-22 NOTE — Anesthesia Preprocedure Evaluation (Addendum)
Anesthesia Evaluation  Patient identified by MRN, date of birth, ID band Patient awake    Reviewed: Allergy & Precautions, H&P , NPO status , Patient's Chart, lab work & pertinent test results  Airway Mallampati: II   Neck ROM: full    Dental   Pulmonary asthma ,           Cardiovascular negative cardio ROS       Neuro/Psych negative neurological ROS     GI/Hepatic negative GI ROS, Neg liver ROS,   Endo/Other  negative endocrine ROS  Renal/GU negative Renal ROS  negative genitourinary   Musculoskeletal   Abdominal   Peds  Hematology  (+) Blood dyscrasia, anemia ,   Anesthesia Other Findings   Reproductive/Obstetrics (+) Pregnancy                            Anesthesia Physical Anesthesia Plan  ASA: 2  Anesthesia Plan: Epidural   Post-op Pain Management:    Induction:   PONV Risk Score and Plan:   Airway Management Planned:   Additional Equipment:   Intra-op Plan:   Post-operative Plan:   Informed Consent: I have reviewed the patients History and Physical, chart, labs and discussed the procedure including the risks, benefits and alternatives for the proposed anesthesia with the patient or authorized representative who has indicated his/her understanding and acceptance.       Plan Discussed with: CRNA and Anesthesiologist  Anesthesia Plan Comments:         Anesthesia Quick Evaluation

## 2021-11-22 NOTE — Progress Notes (Signed)
LABOR NOTE   April Olsen 20 y.o.GP@ at [redacted]w[redacted]d  SUBJECTIVE:  Comfortable Analgesia: Epidural  OBJECTIVE:  BP (!) 106/52 (BP Location: Right Arm)    Pulse 86    Temp 98.1 F (36.7 C) (Oral)    Resp 18    Ht 5\' 7"  (1.702 m)    Wt 86.2 kg    LMP 02/09/2021    SpO2 98%    BMI 29.76 kg/m  Total I/O In: 2 [I.V.:2] Out: -   She has shown cervical change. CERVIX: 4cm:  80%:   -2:   posterior:   firm SVE:   Dilation: 4 Effacement (%): 80 Station: -2 Exam by:: Annia Sima Lindenberger,CNM CONTRACTIONS: regular, every 2 minutes FHR: Fetal heart tracing reviewed. Baseline: 125 bpm, Variability: Good {> 6 bpm), Accelerations: Reactive, and Decelerations: Early Category I    Labs: Lab Results  Component Value Date   WBC 7.9 11/22/2021   HGB 11.4 (L) 11/22/2021   HCT 36.0 11/22/2021   MCV 87.0 11/22/2021   PLT 146 (L) 11/22/2021    ASSESSMENT: 1) Labor curve reviewed.       Progress: Not in labor.     Membranes: ruptured, artifical -clear     IUPC placed due to minimal cervical change with reguar 2 min ctx.       Principal Problem:   Labor and delivery, indication for care   PLAN: continue present management and IV Pitocin induction   11/24/2021, CNM  11/22/2021 11:28 AM

## 2021-11-22 NOTE — Progress Notes (Signed)
April Olsen,CNM called to bedside by RN to evaluate vaginal bleeding from laceration. Pt is requesting pain medicine. Pt given 2mg  of Stadol IV and Olsen,CNM is assessing laceration and bleeding. , MD called to bedside to further evaluate bleeding.

## 2021-11-22 NOTE — Progress Notes (Addendum)
Vaginal packing placed by Doreene Burke, CNM. 2 - 4x14 sponges used for vaginal packing. Straight catheter by CNM and RN- urine output.

## 2021-11-22 NOTE — Progress Notes (Signed)
LABOR NOTE   April Olsen 21 y.o.GP@ at [redacted]w[redacted]d  SUBJECTIVE:  Patient complains of  Pelvic pressure  Analgesia: Epidural  OBJECTIVE:  BP 113/64 (BP Location: Right Arm)    Pulse 76    Temp 97.6 F (36.4 C) (Oral)    Resp 18    Ht 5\' 7"  (1.702 m)    Wt 86.2 kg    LMP 02/09/2021    SpO2 99%    BMI 29.76 kg/m  Total I/O In: 1235.6 [I.V.:1235.6] Out: 350 [Urine:350]  She has shown cervical change. CERVIX: 9cm: per RN exam  SVE:   Dilation: 7.5 Effacement (%): 90 Station: -1 Exam by:: Kamila Maldonado,RN CONTRACTIONS: regular, every 2 minutes FHR: Fetal heart tracing reviewed. Baseline: 130 bpm, Variability: Good {> 6 bpm), Accelerations: Reactive, and Decelerations: occasional late, variable  Category II    Labs: Lab Results  Component Value Date   WBC 7.9 11/22/2021   HGB 11.4 (L) 11/22/2021   HCT 36.0 11/22/2021   MCV 87.0 11/22/2021   PLT 146 (L) 11/22/2021    ASSESSMENT: 1) Labor curve reviewed.       Progress: Active phase labor.     Membranes: ruptured, clear fluid         Principal Problem:   Labor and delivery, indication for care Post dates  PLAN: continue present management   11/24/2021, CNM  11/22/2021 2:32 PM

## 2021-11-22 NOTE — Anesthesia Procedure Notes (Signed)
Epidural Patient location during procedure: OB Start time: 11/22/2021 10:49 AM End time: 11/22/2021 11:05 AM  Staffing Anesthesiologist: Reed Breech, MD Resident/CRNA: Hezzie Bump, CRNA Performed: other anesthesia staff   Preanesthetic Checklist Completed: patient identified, IV checked, site marked, risks and benefits discussed, surgical consent, monitors and equipment checked, pre-op evaluation and timeout performed  Epidural Patient position: sitting Prep: ChloraPrep Patient monitoring: heart rate, continuous pulse ox and blood pressure Approach: midline Location: L3-L4 Injection technique: LOR saline  Needle:  Needle type: Tuohy  Needle gauge: 17 G Needle length: 9 cm and 9 Needle insertion depth: 8 cm Catheter type: closed end flexible Catheter size: 19 Gauge Catheter at skin depth: 13 cm Test dose: negative and 1.5% lidocaine with Epi 1:200 K  Assessment Sensory level: T10 Events: blood not aspirated, injection not painful, no injection resistance, no paresthesia and negative IV test  Additional Notes 1 attempt Pt. Evaluated and documentation done after procedure finished. Patient identified. Risks/Benefits/Options discussed with patient including but not limited to bleeding, infection, nerve damage, paralysis, failed block, incomplete pain control, headache, blood pressure changes, nausea, vomiting, reactions to medication both or allergic, itching and postpartum back pain. Confirmed with bedside nurse the patient's most recent platelet count. Confirmed with patient that they are not currently taking any anticoagulation, have any bleeding history or any family history of bleeding disorders. Patient expressed understanding and wished to proceed. All questions were answered. Sterile technique was used throughout the entire procedure. Please see nursing notes for vital signs. Test dose was given through epidural catheter and negative prior to continuing to dose  epidural or start infusion. Warning signs of high block given to the patient including shortness of breath, tingling/numbness in hands, complete motor block, or any concerning symptoms with instructions to call for help. Patient was given instructions on fall risk and not to get out of bed. All questions and concerns addressed with instructions to call with any issues or inadequate analgesia.    Patient tolerated the insertion well without immediate complications.Reason for block:procedure for pain

## 2021-11-22 NOTE — Progress Notes (Signed)
TOLAC protocol initiated. Pt cervical exam is 3.5/80/-2 per midwife Deneise Lever Thompson,CNM, pitocin infusion initiated. Dr. Amalia Hailey aware and readily available. Erenest Rasher, MD anesthesia aware.

## 2021-11-22 NOTE — H&P (Signed)
History and Physical   HPI  April Olsen is a 21 y.o. [id number] at [redacted]w[redacted]d Estimated Date of Delivery: 11/16/21 who is being admitted for induction of labor due to post dates.    OB History  OB History  Gravida Para Term Preterm AB Living  4 1 1  0 2 1  SAB IAB Ectopic Multiple Live Births  2 0 0 0 1    # Outcome Date GA Lbr Len/2nd Weight Sex Delivery Anes PTL Lv  4 Current           3 SAB           2 SAB           1 Term             PROBLEM LIST  Pregnancy complications or risks: Patient Active Problem List   Diagnosis Date Noted   Labor and delivery, indication for care 11/22/2021   Asthma 05/15/2001    Prenatal labs and studies: ABO, Rh: --/--/PENDING (02/17 0645) Antibody: PENDING (02/17 0553) Rubella: 1.05 (08/10 1458) RPR: Non Reactive (11/28 0945)  HBsAg: Negative (08/10 1458)  HIV: Non Reactive (08/10 1458)  05-09-1985-- (01/20 1622)   Past Medical History:  Diagnosis Date   Anemia    Asthma      Past Surgical History:  Procedure Laterality Date   CESAREAN SECTION       Medications    Current Discharge Medication List     CONTINUE these medications which have NOT CHANGED   Details  albuterol (VENTOLIN HFA) 108 (90 Base) MCG/ACT inhaler Inhale 2 puffs into the lungs every 6 (six) hours as needed for wheezing or shortness of breath. Qty: 8 g, Refills: 2    Iron-FA-B Cmp-C-Biot-Probiotic (FUSION PLUS) CAPS Take 1 tablet by mouth daily. Qty: 30 capsule, Refills: 9    Prenatal Vit-Fe Fumarate-FA (PRENATAL VITAMINS PO) Take by mouth.         Allergies  Motrin [ibuprofen] and Sulfa antibiotics  Review of Systems  Constitutional: negative Eyes: negative Ears, nose, mouth, throat, and face: negative Respiratory: negative Cardiovascular: negative Gastrointestinal: negative Genitourinary:negative Integument/breast: negative Hematologic/lymphatic: negative Musculoskeletal:negative Neurological: negative Behavioral/Psych:  negative Endocrine: negative Allergic/Immunologic: negative  Physical Exam  BP 114/70 (BP Location: Left Arm)    Pulse 81    Temp 98.4 F (36.9 C) (Oral)    Resp 18    Ht 5\' 7"  (1.702 m)    Wt 86.2 kg    LMP 02/09/2021    BMI 29.76 kg/m   Lungs:  CTA B Cardio: RRR  Abd: Soft, gravid, NT Presentation: cephalic EXT: No C/C/ 1+ Edema DTRs: 2+ B CERVIX: Dilation: 3.5 Effacement (%): 80 Cervical Position: Posterior Station: -2 Presentation: Vertex Exam by:: 04/11/2021 Darrie Macmillan,CNM  See Prenatal records for more detailed PE.     FHR:  Baseline: 150 bpm, Variability: Good {> 6 bpm), Accelerations: Reactive, and Decelerations: occasional early and late.  Toco: Uterine Contractions: irregular   Test Results  Results for orders placed or performed during the hospital encounter of 11/22/21 (from the past 24 hour(s))  CBC     Status: Abnormal   Collection Time: 11/22/21  5:53 AM  Result Value Ref Range   WBC 7.9 4.0 - 10.5 K/uL   RBC 4.14 3.87 - 5.11 MIL/uL   Hemoglobin 11.4 (L) 12.0 - 15.0 g/dL   HCT 11/24/21 11/24/21 - 98.9 %   MCV 87.0 80.0 - 100.0 fL   MCH 27.5 26.0 - 34.0  pg   MCHC 31.7 30.0 - 36.0 g/dL   RDW 23.5 (H) 57.3 - 22.0 %   Platelets 146 (L) 150 - 400 K/uL   nRBC 0.0 0.0 - 0.2 %  Type and screen     Status: None (Preliminary result)   Collection Time: 11/22/21  5:53 AM  Result Value Ref Range   ABO/RH(D) PENDING    Antibody Screen PENDING    Sample Expiration      11/25/2021,2359 Performed at [facility], [address] [place], [place] [address]   ABO/Rh     Status: None (Preliminary result)   Collection Time: 11/22/21  6:45 AM  Result Value Ref Range   ABO/RH(D) PENDING    Group B Strep negative  Assessment   [id number] at [redacted]w[redacted]d Estimated Date of Delivery: 11/16/21  The fetus is reassuring. TOLAC  Patient Active Problem List   Diagnosis Date Noted   Labor and delivery, indication for care 11/22/2021   Asthma 05/15/2001    Plan  1. Admit to  L&D :   2. EFM:-- Category 2 3. Stadol or Epidural if desired.   4. Admission labs completed 5. Discussed concerns with pt regarding strip with occasional decelerations. Reassuring overall given moderate variability and accelerations present. Reviewed  possible need for repeat cesarean section should baby not tolerate induction , she verbalizes and agrees to plan.  6. Dr. Logan Bores aware.   Doreene Burke, CNM  11/22/2021 7:50 AM

## 2021-11-23 DIAGNOSIS — O34219 Maternal care for unspecified type scar from previous cesarean delivery: Secondary | ICD-10-CM

## 2021-11-23 DIAGNOSIS — Z3A40 40 weeks gestation of pregnancy: Secondary | ICD-10-CM

## 2021-11-23 DIAGNOSIS — O480 Post-term pregnancy: Principal | ICD-10-CM

## 2021-11-23 LAB — CBC
HCT: 33.3 % — ABNORMAL LOW (ref 36.0–46.0)
Hemoglobin: 10.7 g/dL — ABNORMAL LOW (ref 12.0–15.0)
MCH: 27.9 pg (ref 26.0–34.0)
MCHC: 32.1 g/dL (ref 30.0–36.0)
MCV: 86.7 fL (ref 80.0–100.0)
Platelets: 171 10*3/uL (ref 150–400)
RBC: 3.84 MIL/uL — ABNORMAL LOW (ref 3.87–5.11)
RDW: 16.9 % — ABNORMAL HIGH (ref 11.5–15.5)
WBC: 12.4 10*3/uL — ABNORMAL HIGH (ref 4.0–10.5)
nRBC: 0 % (ref 0.0–0.2)

## 2021-11-23 MED ORDER — ACETAMINOPHEN 325 MG PO TABS: 650.0000 mg | ORAL_TABLET | ORAL | 1 refills | Status: AC | PRN

## 2021-11-23 MED ORDER — DOCUSATE SODIUM 100 MG PO CAPS: 100.0000 mg | ORAL_CAPSULE | Freq: Two times a day (BID) | ORAL | 0 refills | Status: DC

## 2021-11-23 NOTE — Progress Notes (Signed)
Patient discharged home with family.  Discharge instructions, when to follow up, and prescriptions reviewed with patient.  Patient verbalized understanding. Patient will be escorted out by auxiliary.   

## 2021-11-23 NOTE — Discharge Summary (Signed)
Patient Name: April Olsen DOB: [birth year] MRN: [id number]                            Discharge Summary  Date of Admission: 11/22/2021 Date of Discharge: 11/23/2021 Delivering Provider: Philip Aspen   Admitting Diagnosis: Labor and delivery, indication for care [O75.9] at [redacted]w[redacted]d Secondary diagnosis:  Principal Problem:   Labor and delivery, indication for care   Mode of Delivery: normal spontaneous vaginal delivery              Discharge diagnosis: Term Pregnancy Delivered   , Vaginal Birth After Cesarean Section   Intrapartum Procedures: epidural, laceration 2nd degree vaginal and perineal , and pitocin augmentation   Post partum procedures:  none  Complications:  2nd vaginal & perineal laceration                     Discharge Day SOAP Note:  Progress Note - Vaginal Delivery  April Olsen is a 21 y.o. [id number] now PP day 1 s/p VBAC, Spontaneous . Delivery was uncomplicated  Subjective  The patient has the following complaints: has no unusual complaints  Pain is controlled with current medications.   Patient is urinating without difficulty.  She is ambulating well.     Objective  Vital signs: BP 100/63    Pulse 86    Temp 98.5 F (36.9 C) (Oral)    Resp 15    Ht 5\' 7"  (1.702 m)    Wt 86.2 kg    LMP 02/09/2021    SpO2 98%    Breastfeeding Unknown    BMI 29.76 kg/m   Physical Exam: Gen: NAD Fundus Fundal Tone: Firm  Lochia Amount: Small        Data Review Labs: Lab Results  Component Value Date   WBC 12.4 (H) 11/23/2021   HGB 10.7 (L) 11/23/2021   HCT 33.3 (L) 11/23/2021   MCV 86.7 11/23/2021   PLT 171 11/23/2021   CBC Latest Ref Rng & Units 11/23/2021 11/22/2021 09/02/2021  WBC 4.0 - 10.5 K/uL 12.4(H) 7.9 8.5  Hemoglobin 12.0 - 15.0 g/dL 10.7(L) 11.4(L) 9.8(L)  Hematocrit 36.0 - 46.0 % 33.3(L) 36.0 30.8(L)  Platelets 150 - 400 K/uL 171 146(L) 217   O POS Performed at [facility], [address] [place],   [address]   Flavia Shipper Score: Flavia Shipper Postnatal Depression Scale Screening Tool 11/23/2021  I have been able to laugh and see the funny side of things. 0  I have looked forward with enjoyment to things. 0  I have blamed myself unnecessarily when things went wrong. 0  I have been anxious or worried for no good reason. 1  I have felt scared or panicky for no good reason. 0  Things have been getting on top of me. 1  I have been so unhappy that I have had difficulty sleeping. 0  I have felt sad or miserable. 0  I have been so unhappy that I have been crying. 0  The thought of harming myself has occurred to me. 0  Edinburgh Postnatal Depression Scale Total 2    Assessment/Plan  Principal Problem:   Labor and delivery, indication for care    Plan for discharge today.  Discharge Instructions: Per After Visit Summary. Activity: Advance as tolerated. Pelvic rest for 6 weeks.  Also refer to After Visit Summary Diet: Regular Medications: Allergies as of 11/23/2021  Reactions   Motrin [ibuprofen] Hives   Sulfa Antibiotics Hives        Medication List     TAKE these medications    acetaminophen 325 MG tablet Commonly known as: TYLENOL Take 2 tablets (650 mg total) by mouth every 4 (four) hours as needed (for pain scale < 4  OR  temperature  >/=  100.5 F).   albuterol 108 (90 Base) MCG/ACT inhaler Commonly known as: VENTOLIN HFA Inhale 2 puffs into the lungs every 6 (six) hours as needed for wheezing or shortness of breath.   docusate sodium 100 MG capsule Commonly known as: COLACE Take 1 capsule (100 mg total) by mouth 2 (two) times daily.   Fusion Plus Caps Take 1 tablet by mouth daily.   PRENATAL VITAMINS PO Take by mouth.       Outpatient follow up: 2 wk tele visit & 6 wk PPV with Philip Aspen, CNM  Postpartum contraception: Will discuss at first office visit post-partum, she is undecided  Discharged Condition: good  Discharged to: home  Newborn  Data: Disposition:home with mother  Apgars: APGAR (1 MIN): 8   APGAR (5 MINS): 9   APGAR (10 MINS):    Baby Feeding: [place], CNM  11/23/2021 3:43 PM

## 2021-11-23 NOTE — Final Progress Note (Signed)
Discharge Day SOAP Note:  Progress Note - Vaginal Delivery  April Olsen is a 21 y.o. [phone] now PP day 1 s/p VBAC, Spontaneous . Delivery was uncomplicated  Subjective  The patient has the following complaints: has no unusual complaints  Pain is controlled with current medications.   Patient is urinating without difficulty.  She is ambulating well.     Objective  Vital signs: BP 100/63    Pulse 86    Temp 98.5 F (36.9 C) (Oral)    Resp 15    Ht 5\' 7"  (1.702 m)    Wt 86.2 kg    LMP 02/09/2021    SpO2 98%    Breastfeeding Unknown    BMI 29.76 kg/m   Physical Exam: Gen: NAD Fundus Fundal Tone: Firm  Lochia Amount: Small        Data Review Labs: Lab Results  Component Value Date   WBC 12.4 (H) 11/23/2021   HGB 10.7 (L) 11/23/2021   HCT 33.3 (L) 11/23/2021   MCV 86.7 11/23/2021   PLT 171 11/23/2021   CBC Latest Ref Rng & Units 11/23/2021 11/22/2021 09/02/2021  WBC 4.0 - 10.5 K/uL 12.4(H) 7.9 8.5  Hemoglobin 12.0 - 15.0 g/dL 10.7(L) 11.4(L) 9.8(L)  Hematocrit 36.0 - 46.0 % 33.3(L) 36.0 30.8(L)  Platelets 150 - 400 K/uL 171 146(L) 217   O POS Performed at [facility], [address] [place], [place] [address]   Flavia Shipper Score: Flavia Shipper Postnatal Depression Scale Screening Tool 11/23/2021  I have been able to laugh and see the funny side of things. 0  I have looked forward with enjoyment to things. 0  I have blamed myself unnecessarily when things went wrong. 0  I have been anxious or worried for no good reason. 1  I have felt scared or panicky for no good reason. 0  Things have been getting on top of me. 1  I have been so unhappy that I have had difficulty sleeping. 0  I have felt sad or miserable. 0  I have been so unhappy that I have been crying. 0  The thought of harming myself has occurred to me. 0  Edinburgh Postnatal Depression Scale Total 2    Assessment/Plan  Principal Problem:   Labor and delivery, indication for  care    Plan for discharge today.  Discharge Instructions: Per After Visit Summary. Activity: Advance as tolerated. Pelvic rest for 6 weeks.  Also refer to After Visit Summary Diet: Regular Medications: Allergies as of 11/23/2021       Reactions   Motrin [ibuprofen] Hives   Sulfa Antibiotics Hives        Medication List     TAKE these medications    acetaminophen 325 MG tablet Commonly known as: TYLENOL Take 2 tablets (650 mg total) by mouth every 4 (four) hours as needed (for pain scale < 4  OR  temperature  >/=  100.5 F).   albuterol 108 (90 Base) MCG/ACT inhaler Commonly known as: VENTOLIN HFA Inhale 2 puffs into the lungs every 6 (six) hours as needed for wheezing or shortness of breath.   docusate sodium 100 MG capsule Commonly known as: COLACE Take 1 capsule (100 mg total) by mouth 2 (two) times daily.   Fusion Plus Caps Take 1 tablet by mouth daily.   PRENATAL VITAMINS PO Take by mouth.  Outpatient follow up: 2 wk tele visit & 6 wk PPV with Philip Aspen, CNM  Postpartum contraception: Will discuss at first office visit post-partum, she is undecided  Discharged Condition: good  Discharged to: home  Newborn Data: Disposition:home with mother  Apgars: APGAR (1 MIN): 8   APGAR (5 MINS): 9   APGAR (10 MINS):    Baby Feeding: [place], CNM  11/23/2021 3:43 PM

## 2021-11-23 NOTE — Anesthesia Postprocedure Evaluation (Signed)
Anesthesia Post Note  Patient: April Olsen  Procedure(s) Performed: AN AD HOC LABOR EPIDURAL  Patient location during evaluation: Mother Baby Anesthesia Type: Epidural Level of consciousness: awake and alert Pain management: pain level controlled Vital Signs Assessment: post-procedure vital signs reviewed and stable Respiratory status: spontaneous breathing, nonlabored ventilation and respiratory function stable Cardiovascular status: stable Postop Assessment: no headache, no backache, epidural receding, able to ambulate and patient able to bend at knees Anesthetic complications: no   No notable events documented.   Last Vitals:  Vitals:   11/23/21 0335 11/23/21 0729  BP: (!) 99/56 100/63  Pulse: 97 86  Resp: 18 15  Temp: 37.1 C 36.9 C  SpO2: 98% 98%    Last Pain:  Vitals:   11/23/21 0729  TempSrc: Oral  PainSc: 0-No pain                 Foye Deer

## 2021-11-23 NOTE — Discharge Instructions (Signed)

## 2021-12-05 ENCOUNTER — Telehealth: Payer: Medicaid Other | Admitting: Obstetrics

## 2021-12-12 ENCOUNTER — Telehealth (INDEPENDENT_AMBULATORY_CARE_PROVIDER_SITE_OTHER): Payer: Medicaid Other | Admitting: Obstetrics

## 2021-12-13 NOTE — Progress Notes (Signed)
Did not connect on video visit. Nurse attempted to contact. ?

## 2021-12-20 LAB — FETAL NONSTRESS TEST

## 2021-12-23 LAB — FETAL NONSTRESS TEST

## 2022-01-01 ENCOUNTER — Other Ambulatory Visit: Payer: Self-pay

## 2022-01-01 ENCOUNTER — Encounter: Payer: Self-pay | Admitting: Certified Nurse Midwife

## 2022-01-01 ENCOUNTER — Ambulatory Visit (INDEPENDENT_AMBULATORY_CARE_PROVIDER_SITE_OTHER): Payer: Medicaid Other | Admitting: Certified Nurse Midwife

## 2022-01-01 NOTE — Progress Notes (Signed)
Subjective:  ?  ?[name] is a 21 y.o. [phone] Caucasian female who presents for a postpartum visit. She is 6 weeks postpartum following a spontaneous vaginal delivery at 40.6 gestational weeks. Anesthesia: epidural. I have fully reviewed the prenatal and intrapartum course. Postpartum course has been normal. Baby's course has been normal. Baby is feeding by  bottle . Bleeding no bleeding. Bowel function is normal. Bladder function is normal. Patient is not sexually active.. Contraception method is none. Postpartum depression screening: negative. Score 0.  Last pap 05/15/21 and was negative. ? ?The following portions of the patient's history were reviewed and updated as appropriate: allergies, current medications, past medical history, past surgical history and problem list. ? ?Review of Systems ?Pertinent items are noted in HPI.  ? ?Vitals:  ? 01/01/22 1036  ?BP: 98/66  ?Pulse: 69  ?Weight: 184 lb 12.8 oz (83.8 kg)  ?Height: 5\' 7"  (1.702 m)  ? ?No LMP recorded (lmp unknown). ? ?Objective:  ? ?General:  alert, cooperative and no distress  ? Breasts:  deferred, no complaints  ?Lungs: clear to auscultation bilaterally  ?Heart:  regular rate and rhythm  ?Abdomen: soft, nontender  ? Vulva: normal  ?Vagina: normal vagina  ?Cervix:  closed  ?Corpus: Well-involuted  ?Adnexa:  Non-palpable  ?Rectal Exam: No- hemorrhoids  ?      ?Assessment:  ? ?Postpartum exam ?6 wks s/p SVD ?Bottle feeding ?Depression screening ?Contraception counseling  ? ?Plan:  ?: none, is interested in IUD. Discussed placement, risk and benefits .  ?Follow up in:  prn for IUD placement    or earlier if needed ? ? , CNM ? ?  ?

## 2022-01-01 NOTE — Patient Instructions (Signed)
Preventive Care 61-22 Years Old, Female ?Preventive care refers to lifestyle choices and visits with your health care provider that can promote health and wellness. Preventive care visits are also called wellness exams. ?What can I expect for my preventive care visit? ?Counseling ?During your preventive care visit, your health care provider may ask about your: ?Medical history, including: ?Past medical problems. ?Family medical history. ?Pregnancy history. ?Current health, including: ?Menstrual cycle. ?Method of birth control. ?Emotional well-being. ?Home life and relationship well-being. ?Sexual activity and sexual health. ?Lifestyle, including: ?Alcohol, nicotine or tobacco, and drug use. ?Access to firearms. ?Diet, exercise, and sleep habits. ?Work and work Statistician. ?Sunscreen use. ?Safety issues such as seatbelt and bike helmet use. ?Physical exam ?Your health care provider may check your: ?Height and weight. These may be used to calculate your BMI (body mass index). BMI is a measurement that tells if you are at a healthy weight. ?Waist circumference. This measures the distance around your waistline. This measurement also tells if you are at a healthy weight and may help predict your risk of certain diseases, such as type 2 diabetes and high blood pressure. ?Heart rate and blood pressure. ?Body temperature. ?Skin for abnormal spots. ?What immunizations do I need? ?Vaccines are usually given at various ages, according to a schedule. Your health care provider will recommend vaccines for you based on your age, medical history, and lifestyle or other factors, such as travel or where you work. ?What tests do I need? ?Screening ?Your health care provider may recommend screening tests for certain conditions. This may include: ?Pelvic exam and Pap test. ?Lipid and cholesterol levels. ?Diabetes screening. This is done by checking your blood sugar (glucose) after you have not eaten for a while (fasting). ?Hepatitis B  test. ?Hepatitis C test. ?HIV (human immunodeficiency virus) test. ?STI (sexually transmitted infection) testing, if you are at risk. ?BRCA-related cancer screening. This may be done if you have a family history of breast, ovarian, tubal, or peritoneal cancers. ?Talk with your health care provider about your test results, treatment options, and if necessary, the need for more tests. ?Follow these instructions at home: ?Eating and drinking ? ?Eat a healthy diet that includes fresh fruits and vegetables, whole grains, lean protein, and low-fat dairy products. ?Take vitamin and mineral supplements as recommended by your health care provider. ?Do not drink alcohol if: ?Your health care provider tells you not to drink. ?You are pregnant, may be pregnant, or are planning to become pregnant. ?If you drink alcohol: ?Limit how much you have to 0-1 drink a day. ?Know how much alcohol is in your drink. In the [place] one drink equals one 12 oz bottle of beer (355 mL), one 5 oz glass of wine (148 mL), or one 1? oz glass of hard liquor (44 mL). ?Lifestyle ?Brush your teeth every morning and night with fluoride toothpaste. Floss one time each day. ?Exercise for at least 30 minutes 5 or more days each week. ?Do not use any products that contain nicotine or tobacco. These products include cigarettes, chewing tobacco, and vaping devices, such as e-cigarettes. If you need help quitting, ask your health care provider. ?Do not use drugs. ?If you are sexually active, practice safe sex. Use a condom or other form of protection to prevent STIs. ?If you do not wish to become pregnant, use a form of birth control. If you plan to become pregnant, see your health care provider for a prepregnancy visit. ?Find healthy ways to manage stress, such as: ?Meditation, yoga,  or listening to music. ?Journaling. ?Talking to a trusted person. ?Spending time with friends and family. ?Minimize exposure to UV radiation to reduce your risk of skin  cancer. ?Safety ?Always wear your seat belt while driving or riding in a vehicle. ?Do not drive: ?If you have been drinking alcohol. Do not ride with someone who has been drinking. ?If you have been using any mind-altering substances or drugs. ?While texting. ?When you are tired or distracted. ?Wear a helmet and other protective equipment during sports activities. ?If you have firearms in your house, make sure you follow all gun safety procedures. ?Seek help if you have been physically or sexually abused. ?What's next? ?Go to your health care provider once a year for an annual wellness visit. ?Ask your health care provider how often you should have your eyes and teeth checked. ?Stay up to date on all vaccines. ?This information is not intended to replace advice given to you by your health care provider. Make sure you discuss any questions you have with your health care provider. ?Document Revised: 03/20/2021 Document Reviewed: 03/20/2021 ?Elsevier Patient Education ? [place]. ? ?

## 2022-01-07 ENCOUNTER — Encounter: Payer: Medicaid Other | Admitting: Certified Nurse Midwife

## 2022-01-07 DIAGNOSIS — Z30018 Encounter for initial prescription of other contraceptives: Secondary | ICD-10-CM

## 2022-04-08 ENCOUNTER — Other Ambulatory Visit: Payer: Self-pay

## 2022-04-08 ENCOUNTER — Encounter: Payer: Self-pay | Admitting: Emergency Medicine

## 2022-04-08 DIAGNOSIS — Z3A01 Less than 8 weeks gestation of pregnancy: Secondary | ICD-10-CM | POA: Insufficient documentation

## 2022-04-08 DIAGNOSIS — O211 Hyperemesis gravidarum with metabolic disturbance: Secondary | ICD-10-CM | POA: Insufficient documentation

## 2022-04-08 DIAGNOSIS — R1030 Lower abdominal pain, unspecified: Secondary | ICD-10-CM | POA: Insufficient documentation

## 2022-04-08 DIAGNOSIS — J45909 Unspecified asthma, uncomplicated: Secondary | ICD-10-CM | POA: Insufficient documentation

## 2022-04-08 DIAGNOSIS — O219 Vomiting of pregnancy, unspecified: Secondary | ICD-10-CM | POA: Diagnosis present

## 2022-04-08 DIAGNOSIS — O26891 Other specified pregnancy related conditions, first trimester: Secondary | ICD-10-CM | POA: Diagnosis not present

## 2022-04-08 LAB — CBC
HCT: 47.4 % — ABNORMAL HIGH (ref 36.0–46.0)
Hemoglobin: 15.4 g/dL — ABNORMAL HIGH (ref 12.0–15.0)
MCH: 27.8 pg (ref 26.0–34.0)
MCHC: 32.5 g/dL (ref 30.0–36.0)
MCV: 85.7 fL (ref 80.0–100.0)
Platelets: 332 10*3/uL (ref 150–400)
RBC: 5.53 MIL/uL — ABNORMAL HIGH (ref 3.87–5.11)
RDW: 13.6 % (ref 11.5–15.5)
WBC: 9 10*3/uL (ref 4.0–10.5)
nRBC: 0 % (ref 0.0–0.2)

## 2022-04-08 LAB — COMPREHENSIVE METABOLIC PANEL
ALT: 13 U/L (ref 0–44)
AST: 17 U/L (ref 15–41)
Albumin: 4.6 g/dL (ref 3.5–5.0)
Alkaline Phosphatase: 59 U/L (ref 38–126)
Anion gap: 13 (ref 5–15)
BUN: 12 mg/dL (ref 6–20)
CO2: 22 mmol/L (ref 22–32)
Calcium: 9.4 mg/dL (ref 8.9–10.3)
Chloride: 102 mmol/L (ref 98–111)
Creatinine, Ser: 0.6 mg/dL (ref 0.44–1.00)
GFR, Estimated: >60 mL/min (ref >60)
Glucose, Bld: 84 mg/dL (ref 70–99)
Potassium: 3 mmol/L — ABNORMAL LOW (ref 3.5–5.1)
Sodium: 137 mmol/L (ref 135–145)
Total Bilirubin: 1.7 mg/dL — ABNORMAL HIGH (ref 0.3–1.2)
Total Protein: 7.9 g/dL (ref 6.5–8.1)

## 2022-04-08 LAB — LIPASE, BLOOD: Lipase: 21 U/L (ref 11–51)

## 2022-04-08 NOTE — ED Triage Notes (Signed)
EMS brings pt in from home for c/o N/V/D x 4 days; +home pregnancy test

## 2022-04-08 NOTE — ED Triage Notes (Signed)
Pt to ED via [facility]. Pt reports G3, reports approx 1 month pregnant. Pt reports has not been able to keep food down at this time. Pt alert and oriented at this time. Pt also c/o generalized abd pain at this time.

## 2022-04-09 ENCOUNTER — Emergency Department: Payer: Medicaid Other

## 2022-04-09 ENCOUNTER — Other Ambulatory Visit: Payer: Self-pay

## 2022-04-09 ENCOUNTER — Emergency Department
Admission: EM | Admit: 2022-04-09 | Discharge: 2022-04-09 | Disposition: A | Discharge status: Home / Self Care | Payer: Medicaid Other | Attending: Emergency Medicine | Admitting: Emergency Medicine

## 2022-04-09 DIAGNOSIS — O210 Mild hyperemesis gravidarum: Secondary | ICD-10-CM

## 2022-04-09 LAB — URINALYSIS, ROUTINE W REFLEX MICROSCOPIC
Bilirubin Urine: NEGATIVE
Glucose, UA: NEGATIVE mg/dL
Hgb urine dipstick: NEGATIVE
Ketones, ur: 80 mg/dL — AB
Nitrite: NEGATIVE
Protein, ur: 100 mg/dL — AB
Specific Gravity, Urine: 1.03 (ref 1.005–1.030)
pH: 6 (ref 5.0–8.0)

## 2022-04-09 LAB — HCG, QUANTITATIVE, PREGNANCY: hCG, Beta Chain, Quant, S: 42397 m[IU]/mL — ABNORMAL HIGH (ref <5)

## 2022-04-09 MED ORDER — VITAMIN B-6 25 MG PO TABS: 25.0000 mg | ORAL_TABLET | Freq: Three times a day (TID) | ORAL | 0 refills | Status: DC

## 2022-04-09 MED ORDER — FAMOTIDINE 20 MG PO TABS: 20.0000 mg | ORAL_TABLET | Freq: Two times a day (BID) | ORAL | 0 refills | Status: DC

## 2022-04-09 MED ORDER — DOXYLAMINE SUCCINATE (SLEEP) 25 MG PO TABS
25.0000 mg | ORAL_TABLET | Freq: Every evening | ORAL | 0 refills | Status: AC | PRN
Start: 2022-04-09 — End: ?

## 2022-04-09 MED ORDER — SODIUM CHLORIDE 0.9 % IV BOLUS
1000.0000 mL | Freq: Once | INTRAVENOUS | Status: AC
Start: 2022-04-09 — End: 2022-04-09
  Administered 2022-04-09: 1000 mL via INTRAVENOUS

## 2022-04-09 MED ORDER — POTASSIUM CHLORIDE 10 MEQ/100ML IV SOLN
10.0000 meq | INTRAVENOUS | Status: AC
  Administered 2022-04-09 (×2): 10 meq via INTRAVENOUS
  Filled 2022-04-09 (×2): qty 100

## 2022-04-09 MED ORDER — DIPHENHYDRAMINE HCL 50 MG/ML IJ SOLN
25.0000 mg | Freq: Once | INTRAMUSCULAR | Status: AC
Start: 2022-04-09 — End: 2022-04-09
  Administered 2022-04-09: 25 mg via INTRAVENOUS
  Filled 2022-04-09: qty 1

## 2022-04-09 MED ORDER — METOCLOPRAMIDE HCL 5 MG/ML IJ SOLN
10.0000 mg | Freq: Once | INTRAMUSCULAR | Status: AC
  Administered 2022-04-09: 10 mg via INTRAVENOUS
  Filled 2022-04-09: qty 2

## 2022-04-09 MED ORDER — FAMOTIDINE IN NACL 20-0.9 MG/50ML-% IV SOLN
20.0000 mg | Freq: Once | INTRAVENOUS | Status: AC
  Administered 2022-04-09: 20 mg via INTRAVENOUS
  Filled 2022-04-09: qty 50

## 2022-04-09 MED ORDER — CEPHALEXIN 500 MG PO CAPS
500.0000 mg | ORAL_CAPSULE | Freq: Two times a day (BID) | ORAL | 0 refills | Status: AC
Start: 2022-04-09 — End: 2022-04-16

## 2022-04-09 MED ORDER — DEXTROSE 5 % AND 0.9 % NACL IV BOLUS
1000.0000 mL | Freq: Once | INTRAVENOUS | Status: AC
  Administered 2022-04-09: 1000 mL via INTRAVENOUS
  Filled 2022-04-09: qty 1000

## 2022-04-09 NOTE — ED Provider Notes (Signed)
in the middle of May.  [facility] Provider Note    Event Date/Time   First MD Initiated Contact with Patient 04/09/22 [phone]     (approximate)   History   Emesis During Pregnancy   HPI  April Olsen is a 21 y.o. female G3, P2 who presents with nausea vomiting during pregnancy.  Patient found out she was pregnant several days ago.  LMP was in the middle of May.  For the last 3 days she has had multiple episodes of nausea and vomiting unable to count how many.  Says that every time she eats or drinks anything it immediately comes back up.  She also endorses some lower abdominal pain that is constant has had about 1-2 loose stools per day no constipation.  Denies fevers chills urinary symptoms vaginal bleeding vaginal discharge.  No shortness of breath or chest pain.  Patient tried over-the-counter motion sickness medication which did not help.  Past Medical History:  Diagnosis Date   Anemia    Asthma     Patient Active Problem List   Diagnosis Date Noted   Labor and delivery, indication for care 11/22/2021   Asthma 05/15/2001     Physical Exam  Triage Vital Signs: ED Triage Vitals  Enc Vitals Group     BP 04/08/22 2302 109/68     Pulse Rate 04/08/22 2302 (!) 103     Resp 04/08/22 2302 19     Temp 04/08/22 2302 98.3 F (36.8 C)     Temp Source 04/08/22 2302 Oral     SpO2 04/08/22 2237 100 %     Weight 04/08/22 2303 160 lb (72.6 kg)     Height 04/08/22 2303 5\' 7"  (1.702 m)     Head Circumference --      Peak Flow --      Pain Score 04/08/22 2303 7     Pain Loc --      Pain Edu? --      Excl. in GC? --     Most recent vital signs: Vitals:   04/09/22 0605 04/09/22 0630  BP: 104/64 97/68  Pulse: 76 65  Resp: 14 15  Temp:    SpO2: 96% 95%     General: Awake, no distress.  CV:  Good peripheral perfusion.  Resp:  Normal effort.  Abd:  No distention.  Abdomen is soft, mild tenderness in the suprapubic and bilateral lower quadrants no  guarding Neuro:             Awake, Alert, Oriented x 3  Other:     ED Results / Procedures / Treatments  Labs (all labs ordered are listed, but only abnormal results are displayed) Labs Reviewed  COMPREHENSIVE METABOLIC PANEL - Abnormal; Notable for the following components:      Result Value   Potassium 3.0 (*)    Total Bilirubin 1.7 (*)    All other components within normal limits  CBC - Abnormal; Notable for the following components:   RBC 5.53 (*)    Hemoglobin 15.4 (*)    HCT 47.4 (*)    All other components within normal limits  URINALYSIS, ROUTINE W REFLEX MICROSCOPIC - Abnormal; Notable for the following components:   Color, Urine YELLOW (*)    APPearance HAZY (*)    Ketones, ur 80 (*)    Protein, ur 100 (*)    Leukocytes,Ua TRACE (*)    Bacteria, UA RARE (*)    All other components within normal  limits  HCG, QUANTITATIVE, PREGNANCY - Abnormal; Notable for the following components:   hCG, Beta Chain, Quant, S 42,397 (*)    All other components within normal limits  LIPASE, BLOOD  POC URINE PREG, ED     EKG  EKG interpretation performed by myself: NSR, nml axis, nml intervals, no acute ischemic changes    RADIOLOGY First trimester ultrasound interpreted by myself shows an IUP   PROCEDURES:  Critical Care performed: No  .1-3 Lead EKG Interpretation  Performed by: Georga Hacking, MD Authorized by: Georga Hacking, MD     Interpretation: normal     ECG rate assessment: normal     Rhythm: sinus rhythm     Ectopy: none     Conduction: normal     The patient is on the cardiac monitor to evaluate for evidence of arrhythmia and/or significant heart rate changes.   MEDICATIONS ORDERED IN ED: Medications  sodium chloride 0.9 % bolus 1,000 mL (1,000 mLs Intravenous Bolus 04/09/22 0404)  metoCLOPramide (REGLAN) injection 10 mg (10 mg Intravenous Given 04/09/22 0404)  diphenhydrAMINE (BENADRYL) injection 25 mg (25 mg Intravenous Given 04/09/22 0404)   potassium chloride 10 mEq in 100 mL IVPB (0 mEq Intravenous Stopped 04/09/22 0627)  dextrose 5 % and 0.9% NaCl 5-0.9 % bolus 1,000 mL (1,000 mLs Intravenous New Bag/Given 04/09/22 0627)  famotidine (PEPCID) IVPB 20 mg premix (0 mg Intravenous Stopped 04/09/22 0627)     IMPRESSION / MDM / ASSESSMENT AND PLAN / ED COURSE  I reviewed the triage vital signs and the nursing notes.                              Patient's presentation is most consistent with acute complicated illness / injury requiring diagnostic workup.  Differential diagnosis includes, but is not limited to, hyperemesis gravidarum, gastritis, gastroenteritis, appendicitis, pancreatitis, biliary disease  The patient is a 20 year old female whose in the first trimester pregnancy presents with 3 days of intractable nausea vomiting.  Also has associated lower abdominal pain with some diarrhea.  No fevers vaginal bleeding dysuria hematuria.  Patient's vital signs are within normal limits.  She overall appears well and nontoxic abdomen is benign her labs show hypokalemia with a potassium of 3 and hemoglobin is 15 likely in the setting of hemoconcentration.  Renal function is normal.  Beta hCG over 40,000.  Obtained for trimester ultrasound which shows IUP proximately 6 weeks with fetal heart rate of 105.  Plan to resuscitate with IV fluids Reglan and Benadryl and IV potassium.  With patient's benign abdominal exam my suspicion for acute surgical process such as appendicitis as a cause for nausea and vomiting is low suspect this is primarily hyperemesis related to pregnancy.  After Benadryl Reglan and fluids patient is tolerating p.o.  Does have some reflux symptoms still.  Her UA has 80 ketones .  We will give another liter of D5 NS and Pepcid.  Patient's UA has 6-10 WBCs but also significant squames.  Given she is pregnant we will treat for asymptomatic bacteriuria.  Given patient tolerating p.o. and nausea is relatively well controlled I think  she is appropriate for discharge.  Will send with doxylamine and pyridoxine.  We will also write for Pepcid for reflux and Keflex for asymptomatic bacteriuria.  We discussed return precautions.  FINAL CLINICAL IMPRESSION(S) / ED DIAGNOSES   Final diagnoses:  Hyperemesis gravidarum     Rx / DC Orders  ED Discharge Orders          Ordered    doxylamine, Sleep, (UNISOM) 25 MG tablet  At bedtime PRN        04/09/22 0657    pyridOXINE (PYRIDOXINE) 25 MG tablet  3 times daily        04/09/22 0657    cephALEXin (KEFLEX) 500 MG capsule  2 times daily        04/09/22 0657    famotidine (PEPCID) 20 MG tablet  2 times daily        04/09/22 3557             Note:  This document was prepared using Dragon voice recognition software and may include unintentional dictation errors.   Georga Hacking, MD 04/09/22 [phone]

## 2022-04-09 NOTE — Discharge Instructions (Addendum)
Please start taking the Unisom and pyridoxine for nausea and vomiting.  You can take the Unisom at night because it will make you sleepy.  You can take the pyridoxine 25 mg up to 3 times per day.  You can take Pepcid for acid reflux type symptoms.  Please also take the Keflex which is an antibiotic twice a day for the next 7 days to treat bacteria in your urine.  If you are unable to keep fluids down despite these medications please return to the emergency department.

## 2022-04-12 ENCOUNTER — Other Ambulatory Visit: Payer: Self-pay

## 2022-04-12 ENCOUNTER — Emergency Department
Admission: EM | Admit: 2022-04-12 | Discharge: 2022-04-12 | Disposition: A | Discharge status: Home / Self Care | Payer: Medicaid Other | Attending: Emergency Medicine | Admitting: Emergency Medicine

## 2022-04-12 ENCOUNTER — Emergency Department: Payer: Medicaid Other

## 2022-04-12 ENCOUNTER — Encounter: Payer: Self-pay | Admitting: Emergency Medicine

## 2022-04-12 DIAGNOSIS — R945 Abnormal results of liver function studies: Secondary | ICD-10-CM | POA: Diagnosis not present

## 2022-04-12 DIAGNOSIS — O210 Mild hyperemesis gravidarum: Secondary | ICD-10-CM | POA: Insufficient documentation

## 2022-04-12 DIAGNOSIS — R748 Abnormal levels of other serum enzymes: Secondary | ICD-10-CM

## 2022-04-12 DIAGNOSIS — Z3A01 Less than 8 weeks gestation of pregnancy: Secondary | ICD-10-CM | POA: Insufficient documentation

## 2022-04-12 DIAGNOSIS — O219 Vomiting of pregnancy, unspecified: Secondary | ICD-10-CM | POA: Diagnosis present

## 2022-04-12 LAB — COMPREHENSIVE METABOLIC PANEL
ALT: 103 U/L — ABNORMAL HIGH (ref 0–44)
AST: 80 U/L — ABNORMAL HIGH (ref 15–41)
Albumin: 4.6 g/dL (ref 3.5–5.0)
Alkaline Phosphatase: 57 U/L (ref 38–126)
Anion gap: 11 (ref 5–15)
BUN: 9 mg/dL (ref 6–20)
CO2: 22 mmol/L (ref 22–32)
Calcium: 9.5 mg/dL (ref 8.9–10.3)
Chloride: 104 mmol/L (ref 98–111)
Creatinine, Ser: 0.63 mg/dL (ref 0.44–1.00)
GFR, Estimated: >60 mL/min (ref >60)
Glucose, Bld: 106 mg/dL — ABNORMAL HIGH (ref 70–99)
Potassium: 3.1 mmol/L — ABNORMAL LOW (ref 3.5–5.1)
Sodium: 137 mmol/L (ref 135–145)
Total Bilirubin: 2 mg/dL — ABNORMAL HIGH (ref 0.3–1.2)
Total Protein: 8 g/dL (ref 6.5–8.1)

## 2022-04-12 LAB — CBC
HCT: 44.2 % (ref 36.0–46.0)
Hemoglobin: 14.6 g/dL (ref 12.0–15.0)
MCH: 28.1 pg (ref 26.0–34.0)
MCHC: 33 g/dL (ref 30.0–36.0)
MCV: 85 fL (ref 80.0–100.0)
Platelets: 263 10*3/uL (ref 150–400)
RBC: 5.2 MIL/uL — ABNORMAL HIGH (ref 3.87–5.11)
RDW: 13.5 % (ref 11.5–15.5)
WBC: 8.6 10*3/uL (ref 4.0–10.5)
nRBC: 0 % (ref 0.0–0.2)

## 2022-04-12 LAB — HEPATIC FUNCTION PANEL
ALT: 102 U/L — ABNORMAL HIGH (ref 0–44)
AST: 81 U/L — ABNORMAL HIGH (ref 15–41)
Albumin: 4.3 g/dL (ref 3.5–5.0)
Alkaline Phosphatase: 57 U/L (ref 38–126)
Bilirubin, Direct: 0.5 mg/dL — ABNORMAL HIGH (ref 0.0–0.2)
Indirect Bilirubin: 1.5 mg/dL — ABNORMAL HIGH (ref 0.3–0.9)
Total Bilirubin: 2 mg/dL — ABNORMAL HIGH (ref 0.3–1.2)
Total Protein: 7.6 g/dL (ref 6.5–8.1)

## 2022-04-12 LAB — LIPASE, BLOOD: Lipase: 25 U/L (ref 11–51)

## 2022-04-12 MED ORDER — SODIUM CHLORIDE 0.9 % IV BOLUS
1000.0000 mL | Freq: Once | INTRAVENOUS | Status: AC
  Administered 2022-04-12: 1000 mL via INTRAVENOUS

## 2022-04-12 MED ORDER — METOCLOPRAMIDE HCL 10 MG PO TABS: 10.0000 mg | ORAL_TABLET | Freq: Three times a day (TID) | ORAL | 0 refills | Status: DC

## 2022-04-12 MED ORDER — DIPHENHYDRAMINE HCL 50 MG/ML IJ SOLN
25.0000 mg | Freq: Once | INTRAMUSCULAR | Status: AC
  Administered 2022-04-12: 25 mg via INTRAVENOUS
  Filled 2022-04-12: qty 1

## 2022-04-12 MED ORDER — METOCLOPRAMIDE HCL 5 MG/ML IJ SOLN
10.0000 mg | Freq: Once | INTRAMUSCULAR | Status: AC
  Administered 2022-04-12: 10 mg via INTRAVENOUS
  Filled 2022-04-12: qty 2

## 2022-04-12 NOTE — Discharge Instructions (Addendum)
I am writing you a prescription for Reglan which you can use in addition to the doxylamine and pyridoxine for the vomiting.  Please follow-up with your OB/GYN next week.  Your liver function tests were somewhat elevated and you did have gallstones on your ultrasound but it does not think you are having an infection of your gallbladder.  If your right upper quadrant pain worsens you develop fever please return to the emergency department.

## 2022-04-12 NOTE — ED Notes (Signed)
States she is pregnant and has had constantly vomiting an nausea for 2 weeks. States has not been this bad with her other 2 pregnancy. States was here 2 days ago. States has been taking the vitamins that she was told to take but it has not helped.

## 2022-04-12 NOTE — ED Notes (Signed)
Pt endorsing new headache at this time. Provider made aware

## 2022-04-12 NOTE — ED Notes (Signed)
Pt given ginger ale.

## 2022-04-12 NOTE — ED Triage Notes (Signed)
Pt called from [facility] to treatment room, no response 

## 2022-04-12 NOTE — ED Triage Notes (Signed)
Pt reports is about [redacted] weeks pregnant and having hyperemesis. Pt states was seen for the same a few days ago and advised to come back if she still could not keep anything down. Pt reports some burning in her stomach from the vomiting.

## 2022-04-12 NOTE — ED Notes (Signed)
Dc ppw provided to patient. followup and RX information reviewed. Pt provides verbal consent for dc at this time and declines vs at time of dc. Pt ambulatory to lobby alert and oriented. 

## 2022-04-12 NOTE — ED Provider Notes (Signed)
[facility] Provider Note    Event Date/Time   First MD Initiated Contact with Patient 04/12/22 0930     (approximate)   History   Emesis During Pregnancy   HPI  April Olsen is a 21 y.o. female   presents to the ED with complaint of continued emesis with her pregnancy.  Patient was seen in the emergency department on 04/08/2022 and was given IV fluids, potassium chloride, famotidine, Benadryl, and Reglan.  Patient reports that she continues taking the famotidine at home but began vomiting and reports at least 12 times in the last 24 hours.  She states that she has called her OB/GYN and has an appointment next week.      Physical Exam   Triage Vital Signs: ED Triage Vitals  Enc Vitals Group     BP 04/12/22 0732 111/78     Pulse Rate 04/12/22 0732 90     Resp 04/12/22 0732 18     Temp 04/12/22 0732 98.5 F (36.9 C)     Temp Source 04/12/22 0732 Oral     SpO2 04/12/22 0732 97 %     Weight 04/12/22 0722 158 lb 11.7 oz (72 kg)     Height 04/12/22 0722 5\' 7"  (1.702 m)     Head Circumference --      Peak Flow --      Pain Score 04/12/22 0722 8     Pain Loc --      Pain Edu? --      Excl. in GC? --     Most recent vital signs: Vitals:   04/12/22 1347 04/12/22 1500  BP: (!) 100/59 94/69  Pulse: 82 84  Resp: 15 16  Temp: 98.5 F (36.9 C)   SpO2: 100% 100%     General: Awake, no distress.  No active vomiting at present. CV:  Good peripheral perfusion.  Regular rate and rhythm. Resp:  Normal effort.  Clear bilaterally. Abd:  No distention.  Soft, bowel sounds normoactive x4 quadrants. Other:     ED Results / Procedures / Treatments   Labs (all labs ordered are listed, but only abnormal results are displayed) Labs Reviewed  COMPREHENSIVE METABOLIC PANEL - Abnormal; Notable for the following components:      Result Value   Potassium 3.1 (*)    Glucose, Bld 106 (*)    AST 80 (*)    ALT 103 (*)    Total Bilirubin 2.0 (*)    All other  components within normal limits  CBC - Abnormal; Notable for the following components:   RBC 5.20 (*)    All other components within normal limits  LIPASE, BLOOD  HEPATIC FUNCTION PANEL      RADIOLOGY  Ultrasound gallbladder shows sludge and small stones per radiologist.   PROCEDURES:  Critical Care performed:   Procedures   MEDICATIONS ORDERED IN ED: Medications  sodium chloride 0.9 % bolus 1,000 mL (0 mLs Intravenous Stopped 04/12/22 1206)  metoCLOPramide (REGLAN) injection 10 mg (10 mg Intravenous Given 04/12/22 1033)  diphenhydrAMINE (BENADRYL) injection 25 mg (25 mg Intravenous Given 04/12/22 1033)     IMPRESSION / MDM / ASSESSMENT AND PLAN / ED COURSE  I reviewed the triage vital signs and the nursing notes.   Differential diagnosis includes, but is not limited to, hyperemesis secondary to pregnancy, elevated liver function test, rule out cholelithiasis.  21 year old female who is approximately [redacted] weeks pregnant, presents to the ED with complaint of hyperemesis and was  seen in the emergency department on 04/08/2022 into 04/09/22.  Patient returns today with similar complaints stating that the medication at home has not helped and in the last 24 hours has vomited approximately 10 times.  Lab work did show elevated liver functions and Dr. Lenard Lance was in to see the patient as well.  Ultrasound gallbladder was ordered for evaluation as she developed some minimal right upper quadrant tenderness during her ED stay.  Ultrasound did show some sludge and small stones present.  Liver function panel was ordered and Dr. Sidney Ace will be taking over the care of this patient.      Patient's presentation is most consistent with acute presentation with potential threat to life or bodily function.  FINAL CLINICAL IMPRESSION(S) / ED DIAGNOSES   Final diagnoses:  Hyperemesis arising during pregnancy  Abnormal liver enzymes     Rx / DC Orders   ED Discharge Orders     None         Note:  This document was prepared using Dragon voice recognition software and may include unintentional dictation errors.   Tommi Rumps, PA-C 04/13/22 1046    Minna Antis, MD 04/14/22 1451

## 2022-04-12 NOTE — ED Provider Notes (Signed)
This patient was signed out to me at 3 PM.  In brief she is a 21 year old female who is in the first trimester pregnancy presenting with vomiting and nausea.  Seen by myself several days ago discharged with doxylamine pyridoxine.  Today she presents with mildly elevated LFTs.  I discussed with Dr. Maia Plan with general surgery as patient's ultrasound also has some gallstones and sludge but no signs of cholecystitis and she is having some right upper quadrant pain.  He recommends checking a hepatic function panel to see if bilirubin which is 2 is direct or indirect and then if largely indirect it is unlikely that this is choledocholithiasis.  Since indirect bilirubin is 1.5 the direct is 0.5.  She has no dilatation of the CBD on ultrasound and on exam she is comfortable with minimal right upper quadrant pain.  Discussed again with Dr. Maia Plan who agrees this is unlikely to be choledocholithiasis does not feel that she needs an MRCP.  Patient is tolerating p.o. here.  She is already taking Dexilant and pyridoxine.  We will add on Reglan.  She has an appointment in [place] next week.   Georga Hacking, MD 04/12/22 1714

## 2022-04-17 ENCOUNTER — Inpatient Hospital Stay
Admission: EM | Admit: 2022-04-17 | Discharge: 2022-04-19 | DRG: 833 | Disposition: A | Discharge status: Home / Self Care | Payer: Medicaid Other | Attending: Certified Nurse Midwife | Admitting: Certified Nurse Midwife

## 2022-04-17 ENCOUNTER — Encounter: Payer: Self-pay | Admitting: Intensive Care

## 2022-04-17 ENCOUNTER — Emergency Department: Payer: Medicaid Other

## 2022-04-17 ENCOUNTER — Other Ambulatory Visit: Payer: Self-pay

## 2022-04-17 DIAGNOSIS — Z3A01 Less than 8 weeks gestation of pregnancy: Secondary | ICD-10-CM | POA: Diagnosis not present

## 2022-04-17 DIAGNOSIS — O210 Mild hyperemesis gravidarum: Secondary | ICD-10-CM | POA: Diagnosis not present

## 2022-04-17 DIAGNOSIS — E8729 Other acidosis: Secondary | ICD-10-CM

## 2022-04-17 DIAGNOSIS — J45909 Unspecified asthma, uncomplicated: Secondary | ICD-10-CM | POA: Diagnosis present

## 2022-04-17 DIAGNOSIS — T730XXA Starvation, initial encounter: Secondary | ICD-10-CM

## 2022-04-17 DIAGNOSIS — O99511 Diseases of the respiratory system complicating pregnancy, first trimester: Secondary | ICD-10-CM | POA: Diagnosis present

## 2022-04-17 DIAGNOSIS — O34219 Maternal care for unspecified type scar from previous cesarean delivery: Secondary | ICD-10-CM | POA: Diagnosis present

## 2022-04-17 DIAGNOSIS — R1110 Vomiting, unspecified: Secondary | ICD-10-CM | POA: Diagnosis present

## 2022-04-17 LAB — CBC
HCT: 46.4 % — ABNORMAL HIGH (ref 36.0–46.0)
Hemoglobin: 15 g/dL (ref 12.0–15.0)
MCH: 27.9 pg (ref 26.0–34.0)
MCHC: 32.3 g/dL (ref 30.0–36.0)
MCV: 86.4 fL (ref 80.0–100.0)
Platelets: 258 10*3/uL (ref 150–400)
RBC: 5.37 MIL/uL — ABNORMAL HIGH (ref 3.87–5.11)
RDW: 13.7 % (ref 11.5–15.5)
WBC: 8.5 10*3/uL (ref 4.0–10.5)
nRBC: 0 % (ref 0.0–0.2)

## 2022-04-17 LAB — COMPREHENSIVE METABOLIC PANEL
ALT: 179 U/L — ABNORMAL HIGH (ref 0–44)
AST: 91 U/L — ABNORMAL HIGH (ref 15–41)
Albumin: 4.5 g/dL (ref 3.5–5.0)
Alkaline Phosphatase: 82 U/L (ref 38–126)
Anion gap: 16 — ABNORMAL HIGH (ref 5–15)
BUN: 6 mg/dL (ref 6–20)
CO2: 15 mmol/L — ABNORMAL LOW (ref 22–32)
Calcium: 9.3 mg/dL (ref 8.9–10.3)
Chloride: 105 mmol/L (ref 98–111)
Creatinine, Ser: 0.57 mg/dL (ref 0.44–1.00)
GFR, Estimated: >60 mL/min (ref >60)
Glucose, Bld: 76 mg/dL (ref 70–99)
Potassium: 3.2 mmol/L — ABNORMAL LOW (ref 3.5–5.1)
Sodium: 136 mmol/L (ref 135–145)
Total Bilirubin: 1.9 mg/dL — ABNORMAL HIGH (ref 0.3–1.2)
Total Protein: 8.1 g/dL (ref 6.5–8.1)

## 2022-04-17 LAB — LIPASE, BLOOD: Lipase: 24 U/L (ref 11–51)

## 2022-04-17 LAB — BLOOD GAS, VENOUS
Acid-base deficit: 9.3 mmol/L — ABNORMAL HIGH (ref 0.0–2.0)
Bicarbonate: 15.5 mmol/L — ABNORMAL LOW (ref 20.0–28.0)
O2 Saturation: 68.2 %
Patient temperature: 37
pCO2, Ven: 30 mmHg — ABNORMAL LOW (ref 44–60)
pH, Ven: 7.32 (ref 7.25–7.43)
pO2, Ven: 40 mmHg (ref 32–45)

## 2022-04-17 LAB — BETA-HYDROXYBUTYRIC ACID: Beta-Hydroxybutyric Acid: 4.51 mmol/L — ABNORMAL HIGH (ref 0.05–0.27)

## 2022-04-17 LAB — MAGNESIUM: Magnesium: 2.1 mg/dL (ref 1.7–2.4)

## 2022-04-17 LAB — LACTIC ACID, PLASMA: Lactic Acid, Venous: 1.4 mmol/L (ref 0.5–1.9)

## 2022-04-17 LAB — HCG, QUANTITATIVE, PREGNANCY: hCG, Beta Chain, Quant, S: 119400 m[IU]/mL — ABNORMAL HIGH (ref <5)

## 2022-04-17 LAB — CK: Total CK: 53 U/L (ref 38–234)

## 2022-04-17 LAB — BILIRUBIN, DIRECT: Bilirubin, Direct: 0.5 mg/dL — ABNORMAL HIGH (ref 0.0–0.2)

## 2022-04-17 MED ORDER — SODIUM CHLORIDE 0.9 % IV BOLUS
1000.0000 mL | Freq: Once | INTRAVENOUS | Status: AC
  Administered 2022-04-17: 1000 mL via INTRAVENOUS

## 2022-04-17 MED ORDER — ONDANSETRON HCL 4 MG/2ML IJ SOLN
4.0000 mg | INTRAMUSCULAR | Status: AC
  Administered 2022-04-17: 4 mg via INTRAVENOUS
  Filled 2022-04-17: qty 2

## 2022-04-17 MED ORDER — SIMETHICONE 80 MG PO CHEW
80.0000 mg | CHEWABLE_TABLET | Freq: Four times a day (QID) | ORAL | Status: DC | PRN
  Administered 2022-04-18 – 2022-04-19 (×2): 80 mg via ORAL
  Filled 2022-04-17 (×2): qty 1

## 2022-04-17 MED ORDER — ONDANSETRON HCL 4 MG/2ML IJ SOLN
4.0000 mg | Freq: Three times a day (TID) | INTRAMUSCULAR | Status: DC | PRN
  Administered 2022-04-18 – 2022-04-19 (×2): 4 mg via INTRAVENOUS
  Filled 2022-04-17 (×2): qty 2

## 2022-04-17 MED ORDER — PROMETHAZINE HCL 25 MG RE SUPP: 12.5000 mg | RECTAL | Status: DC | PRN

## 2022-04-17 MED ORDER — CALCIUM CARBONATE ANTACID 500 MG PO CHEW
2.0000 | CHEWABLE_TABLET | ORAL | Status: DC | PRN
  Administered 2022-04-17 – 2022-04-18 (×2): 400 mg via ORAL
  Filled 2022-04-17 (×2): qty 2

## 2022-04-17 MED ORDER — FOLIC ACID 5 MG/ML IJ SOLN
Freq: Once | INTRAMUSCULAR | Status: AC
Start: 2022-04-17 — End: 2022-04-17
  Filled 2022-04-17: qty 1000

## 2022-04-17 MED ORDER — SODIUM CHLORIDE 0.9 % IV SOLN
8.0000 mg | Freq: Three times a day (TID) | INTRAVENOUS | Status: DC | PRN
  Filled 2022-04-17: qty 4

## 2022-04-17 MED ORDER — FAMOTIDINE 20 MG PO TABS
20.0000 mg | ORAL_TABLET | Freq: Two times a day (BID) | ORAL | Status: DC
  Administered 2022-04-17 – 2022-04-19 (×2): 20 mg via ORAL
  Filled 2022-04-17 (×3): qty 1

## 2022-04-17 MED ORDER — PROMETHAZINE HCL 25 MG PO TABS: 12.5000 mg | ORAL_TABLET | ORAL | Status: DC | PRN

## 2022-04-17 MED ORDER — ACETAMINOPHEN 325 MG PO TABS
650.0000 mg | ORAL_TABLET | ORAL | Status: DC | PRN
  Administered 2022-04-18 – 2022-04-19 (×3): 650 mg via ORAL
  Filled 2022-04-17 (×3): qty 2

## 2022-04-17 MED ORDER — METOCLOPRAMIDE HCL 5 MG/ML IJ SOLN
10.0000 mg | Freq: Once | INTRAMUSCULAR | Status: AC
  Administered 2022-04-17: 10 mg via INTRAVENOUS
  Filled 2022-04-17: qty 2

## 2022-04-17 MED ORDER — ALBUTEROL SULFATE HFA 108 (90 BASE) MCG/ACT IN AERS
2.0000 | INHALATION_SPRAY | Freq: Four times a day (QID) | RESPIRATORY_TRACT | Status: DC | PRN
Start: 2022-04-17 — End: 2022-04-20
  Administered 2022-04-18: 2 via RESPIRATORY_TRACT
  Filled 2022-04-17: qty 6.7

## 2022-04-17 MED ORDER — ZOLPIDEM TARTRATE 5 MG PO TABS
5.0000 mg | ORAL_TABLET | Freq: Every evening | ORAL | Status: DC | PRN
  Administered 2022-04-17: 5 mg via ORAL
  Filled 2022-04-17: qty 1

## 2022-04-17 MED ORDER — HYDROXYZINE HCL 25 MG PO TABS: 50.0000 mg | ORAL_TABLET | Freq: Four times a day (QID) | ORAL | Status: DC | PRN

## 2022-04-17 MED ORDER — KCL-LACTATED RINGERS-D5W 20 MEQ/L IV SOLN: INTRAVENOUS | Status: DC

## 2022-04-17 MED ORDER — KCL-LACTATED RINGERS-D5W 20 MEQ/L IV SOLN
INTRAVENOUS | Status: DC
  Filled 2022-04-17 (×12): qty 1000

## 2022-04-17 MED ORDER — HYDROXYZINE HCL 50 MG/ML IM SOLN: 50.0000 mg | Freq: Four times a day (QID) | INTRAMUSCULAR | Status: DC | PRN

## 2022-04-17 MED ORDER — FAMOTIDINE IN NACL 20-0.9 MG/50ML-% IV SOLN
20.0000 mg | Freq: Two times a day (BID) | INTRAVENOUS | Status: DC
  Administered 2022-04-18: 20 mg via INTRAVENOUS
  Filled 2022-04-17 (×7): qty 50

## 2022-04-17 MED ORDER — PRENATAL MULTIVITAMIN CH
1.0000 | ORAL_TABLET | Freq: Every day | ORAL | Status: DC
  Administered 2022-04-19: 1 via ORAL
  Filled 2022-04-17 (×2): qty 1

## 2022-04-17 MED ORDER — DEXTROSE 5 % IN LACTATED RINGERS IV BOLUS
500.0000 mL | Freq: Once | INTRAVENOUS | Status: AC
  Administered 2022-04-17: 500 mL via INTRAVENOUS
  Filled 2022-04-17: qty 500
  Filled 2022-04-17: qty 1000

## 2022-04-17 MED ORDER — ONDANSETRON 4 MG PO TBDP
4.0000 mg | ORAL_TABLET | Freq: Three times a day (TID) | ORAL | Status: DC | PRN
  Administered 2022-04-19: 8 mg via ORAL
  Filled 2022-04-17: qty 2

## 2022-04-17 NOTE — ED Notes (Signed)
See triage note. Pt reports has been vomiting for past 2 weeks. Pt currently laying calmly on stretcher, alert, resp reg/unlabored, skin dry.

## 2022-04-17 NOTE — Consult Note (Signed)
Reason for Consult:pt presented to ED 7 weeks 2 days pregnant with complaint of nausea and vomiting 8-10 daily for several days.  Referring Physician: [name] is an 21 y.o. female.  HPI: G5 P2022 @ 7 wks 2 days EDD 12/02/2022 hyperemesis. Was seen in ED 04/12/22 with nausea & vomiting ,  gallstones and sludge and was discharged home with antemetic's.  Repeat u/s today results pending.   Past Medical History:  Diagnosis Date   Anemia    Asthma     Past Surgical History:  Procedure Laterality Date   CESAREAN SECTION      History reviewed. No pertinent family history.  Social History:  reports that she has never smoked. She has never used smokeless tobacco. She reports that she does not currently use alcohol. She reports that she does not currently use drugs.  Allergies:  Allergies  Allergen Reactions   Motrin [Ibuprofen] Hives   Sulfa Antibiotics Hives    Medications: I have reviewed the patient's current medications.  Results for orders placed or performed during the hospital encounter of 04/17/22 (from the past 48 hour(s))  Lipase, blood     Status: None   Collection Time: 04/17/22 11:52 AM  Result Value Ref Range   Lipase 24 11 - 51 U/L    Comment: Performed at [facility], [address] [place], [place] [address]  Comprehensive metabolic panel     Status: Abnormal   Collection Time: 04/17/22 11:52 AM  Result Value Ref Range   Sodium 136 135 - 145 mmol/L   Potassium 3.2 (L) 3.5 - 5.1 mmol/L   Chloride 105 98 - 111 mmol/L   CO2 15 (L) 22 - 32 mmol/L   Glucose, Bld 76 70 - 99 mg/dL    Comment: Glucose reference range applies only to samples taken after fasting for at least 8 hours.   BUN 6 6 - 20 mg/dL   Creatinine, Ser 1.54 0.44 - 1.00 mg/dL   Calcium 9.3 8.9 - 00.8 mg/dL   Total Protein 8.1 6.5 - 8.1 g/dL   Albumin 4.5 3.5 - 5.0 g/dL   AST 91 (H) 15 - 41 U/L   ALT 179 (H) 0 - 44 U/L   Alkaline Phosphatase 82 38 - 126 U/L   Total  Bilirubin 1.9 (H) 0.3 - 1.2 mg/dL   GFR, Estimated >67 >61 mL/min    Comment: (NOTE) Calculated using the CKD-EPI Creatinine Equation (2021)    Anion gap 16 (H) 5 - 15    Comment: Performed at [facility], [address] [place], [place] [address]  CBC     Status: Abnormal   Collection Time: 04/17/22 11:52 AM  Result Value Ref Range   WBC 8.5 4.0 - 10.5 K/uL   RBC 5.37 (H) 3.87 - 5.11 MIL/uL   Hemoglobin 15.0 12.0 - 15.0 g/dL   HCT 26.7 (H) 12.4 - 58.0 %   MCV 86.4 80.0 - 100.0 fL   MCH 27.9 26.0 - 34.0 pg   MCHC 32.3 30.0 - 36.0 g/dL   RDW 99.8 33.8 - 25.0 %   Platelets 258 150 - 400 K/uL   nRBC 0.0 0.0 - 0.2 %    Comment: Performed at [facility], [address] [place], [place] [address]  hCG, quantitative, pregnancy     Status: Abnormal   Collection Time: 04/17/22 11:52 AM  Result Value Ref Range   hCG, Beta Chain, Quant, S 119,400 (H) <5 mIU/mL  Comment:          GEST. AGE      CONC.  (mIU/mL)   <=1 WEEK        5 - 50     2 WEEKS       50 - 500     3 WEEKS       100 - 10,000     4 WEEKS     1,000 - 30,000     5 WEEKS     3,500 - 115,000   6-8 WEEKS     12,000 - 270,000    12 WEEKS     15,000 - 220,000        FEMALE AND NON-PREGNANT FEMALE:     LESS THAN 5 mIU/mL Performed at [facility], [address] [place], [place] [address]   CK     Status: None   Collection Time: 04/17/22 12:23 PM  Result Value Ref Range   Total CK 53 38 - 234 U/L    Comment: Performed at [facility], [address] [place], [place] [address]  Bilirubin, direct     Status: Abnormal   Collection Time: 04/17/22 12:23 PM  Result Value Ref Range   Bilirubin, Direct 0.5 (H) 0.0 - 0.2 mg/dL    Comment: Performed at [facility], [address], [place], [place] [address]  Magnesium     Status: None   Collection Time: 04/17/22 12:40 PM  Result Value Ref Range   Magnesium 2.1 1.7 - 2.4 mg/dL    Comment: Performed at [facility], [address] [place], [place] [address]    No results found.  Review of Systems Blood pressure 118/80, pulse 84, temperature 97.6 F (36.4 C), temperature source Oral, resp. rate 16, height 5\' 7"  (1.702 m), weight 63.5 kg, last menstrual period 02/17/2022, SpO2 100 %, not currently breastfeeding. Physical Exam Constitutional:      Appearance: Normal appearance.  HENT:     Mouth/Throat:     Mouth: Mucous membranes are moist.  Pulmonary:     Effort: Pulmonary effort is normal.  Abdominal:     Palpations: Abdomen is soft.  Musculoskeletal:        General: Normal range of motion.  Skin:    General: Skin is warm.  Neurological:     Mental Status: She is alert.      Assessment/Plan: Discussed admission to antepartum unit for IV hydration, antemetic therapy , and gut rest.   02/19/2022 04/17/2022, 1:14 PM

## 2022-04-17 NOTE — ED Provider Notes (Signed)
[facility] Provider Note    Event Date/Time   First MD Initiated Contact with Patient 04/17/22 1213     (approximate)   History   Emesis During Pregnancy   HPI  April Olsen is a 21 y.o. female who is approximately [redacted] weeks pregnant  Patient for the last 2 weeks has had severe nausea vomiting and reports she can barely eat anything.  She reports she is now gone several days without being able to keep any solid food on her stomach.  She feels fatigued and dehydrated.  She will have intermittent pain when she tries to eat usually in the middle to upper abdomen particular the right upper abdomen.   She relates feeling thirsty but cannot keep fluids or food down now for several days  She has been trying Zofran, metoclopramide, and doxylamine without relief  Advises [facility] primary [facility]  No vaginal bleeding.  Denies abdominal pain except when she tries to eat  Physical Exam   Triage Vital Signs: ED Triage Vitals  Enc Vitals Group     BP 04/17/22 1149 118/80     Pulse Rate 04/17/22 1149 84     Resp 04/17/22 1149 16     Temp 04/17/22 1149 97.6 F (36.4 C)     Temp Source 04/17/22 1149 Oral     SpO2 04/17/22 1149 100 %     Weight 04/17/22 1150 140 lb (63.5 kg)     Height 04/17/22 1150 5\' 7"  (1.702 m)     Head Circumference --      Peak Flow --      Pain Score 04/17/22 1150 7     Pain Loc --      Pain Edu? --      Excl. in GC? --     Most recent vital signs: Vitals:   04/17/22 1149  BP: 118/80  Pulse: 84  Resp: 16  Temp: 97.6 F (36.4 C)  SpO2: 100%     General: Awake, no distress.  Mucous membranes are dry.  Tacky membranes CV:  Good peripheral perfusion.  Normal heart tones normal rhythm Resp:  Normal effort.  Clear bilateral Abd:  No distention.  Soft nontender nondistended except she reports mild discomfort in epigastrium and right upper quadrant but negative for any obvious positive Murphy.  Not obviously  gravid. Other:     ED Results / Procedures / Treatments   Labs (all labs ordered are listed, but only abnormal results are displayed) Labs Reviewed  COMPREHENSIVE METABOLIC PANEL - Abnormal; Notable for the following components:      Result Value   Potassium 3.2 (*)    CO2 15 (*)    AST 91 (*)    ALT 179 (*)    Total Bilirubin 1.9 (*)    Anion gap 16 (*)    All other components within normal limits  CBC - Abnormal; Notable for the following components:   RBC 5.37 (*)    HCT 46.4 (*)    All other components within normal limits  HCG, QUANTITATIVE, PREGNANCY - Abnormal; Notable for the following components:   hCG, Beta Chain, Quant, S 119,400 (*)    All other components within normal limits  BETA-HYDROXYBUTYRIC ACID - Abnormal; Notable for the following components:   Beta-Hydroxybutyric Acid 4.51 (*)    All other components within normal limits  BLOOD GAS, VENOUS - Abnormal; Notable for the following components:   pCO2, Ven 30 (*)  Bicarbonate 15.5 (*)    Acid-base deficit 9.3 (*)    All other components within normal limits  BILIRUBIN, DIRECT - Abnormal; Notable for the following components:   Bilirubin, Direct 0.5 (*)    All other components within normal limits  LIPASE, BLOOD  LACTIC ACID, PLASMA  CK  MAGNESIUM  URINALYSIS, ROUTINE W REFLEX MICROSCOPIC  HEPATITIS PANEL, ACUTE  POC URINE PREG, ED   Labs reviewed notable for hypokalemia, notably reduced CO2 mild what appears to be persistent transaminitis compared with previous visit, elevated anion gap  EKG     RADIOLOGY  Right upper quadrant ultrasound is interpreted by me, grossly I do not see evidence of acute cholecystitis or biliary ductal dilatation.  I do not clearly see stones though some slight shadowing may be present  US ABDOMEN LIMITED RUQ (LIVER/GB)  Result Date: 04/17/2022 CLINICAL DATA:  Abdominal pain for 1 week. EXAM: ULTRASOUND ABDOMEN LIMITED RIGHT UPPER QUADRANT COMPARISON:  04/12/2022  FINDINGS: Gallbladder: Echogenic sludge is seen nearly filling the gallbladder which limits evaluation, however no definite shadowing gallstones are identified. No evidence of gallbladder wall thickening or pericholecystic fluid. No sonographic Murphy sign noted by sonographer. Common bile duct: Diameter: 2 mm, within normal limits. Liver: No focal lesion identified. Within normal limits in parenchymal echogenicity. Portal vein is patent on color Doppler imaging with normal direction of blood flow towards the liver. Other: None. IMPRESSION: Gallbladder sludge, without definite gallstones identified. No evidence of cholecystitis or biliary ductal dilatation. Electronically Signed   By: Danae Orleans M.D.   On: 04/17/2022 14:08      PROCEDURES:  Critical Care performed: No  Procedures   MEDICATIONS ORDERED IN ED: Medications  ondansetron (ZOFRAN) injection 4 mg (4 mg Intravenous Given 04/17/22 1257)  metoCLOPramide (REGLAN) injection 10 mg (10 mg Intravenous Given 04/17/22 1257)  sodium chloride 0.9 % bolus 1,000 mL (1,000 mLs Intravenous New Bag/Given 04/17/22 1257)  dextrose 5% lactated ringers bolus 500 mL (500 mLs Intravenous New Bag/Given 04/17/22 1429)     IMPRESSION / MDM / ASSESSMENT AND PLAN / ED COURSE  I reviewed the triage vital signs and the nursing notes.                              Differential diagnosis includes, but is not limited to, hyperemesis, cholecystitis, choledocholithiasis, gastritis, pregnancy complication, molar pregnancy etc.  Patient's presentation is most consistent with acute presentation with potential threat to life or bodily function.  Very reassuring abdominal exam except for some discomfort epigastric and slight right upper quadrant.  No clinical signs or symptoms of would be suggestive of acute bowel obstruction.  No tympany no distention.  Clinical Course as of 04/17/22 1448  Thu Apr 17, 2022  1257 Patient's case including recent right upper quadrant  and previous ED visit and today's visit discussed with Dr. Dalbert Garnet. [MQ]  1257 Dr. Dalbert Garnet advises nurse midwife Doreene Burke will be coming to see the patient for consultation [MQ]  1305 Labs reviewed notable for low CO2, elevated anion gap.  VBG lactic acid and CK ordered.  hCG has rise [MQ]    Clinical Course User Index [MQ] Sharyn Creamer, MD   Patient's beta hydroxybutyrate is quite elevated.  Based upon this and her elevated anion gap I suspect elevated anion gap is due to her ketosis.  Discussed with Doreene Burke, midwife, will initiate D5LR at 125/h  ----------------------------------------- 2:46 PM on 04/17/2022 ----------------------------------------- Case has  been discussed with Dr. Dalbert Garnet as well as Doreene Burke who is seen the patient in the ER.  Having reviewed right upper quadrant ultrasound that shows no evidence of biliary ductal dilatation or obvious cholecystitis, obstetrical service will admit the patient for further care and treatment.  Patient is understanding agreeable with plan for admission.  Acute hepatitis panel sent, Doreene Burke and the OB/GYN service will follow-up on results    FINAL CLINICAL IMPRESSION(S) / ED DIAGNOSES   Final diagnoses:  Hyperemesis gravidarum  Starvation ketoacidosis     Rx / DC Orders   ED Discharge Orders     None        Note:  This document was prepared using Dragon voice recognition software and may include unintentional dictation errors.   Sharyn Creamer, MD 04/17/22 [phone]

## 2022-04-17 NOTE — H&P (Signed)
FACULTY PRACTICE ANTEPARTUM ADMISSION HISTORY AND PHYSICAL NOTE   History of Present Illness: April Olsen is a 21 y.o. [id number] at [redacted]w[redacted]d admitted for hyperemesis .   Patient reports the fetal movement as  none . Patient reports uterine contraction  activity as none. Patient reports  vaginal bleeding as none. Patient describes fluid per vagina as None. Fetal presentation is  unknown .  Patient Active Problem List   Diagnosis Date Noted   Hyperemesis 04/17/2022   Labor and delivery, indication for care 11/22/2021   Asthma 05/15/2001    Past Medical History:  Diagnosis Date   Anemia    Asthma     Past Surgical History:  Procedure Laterality Date   CESAREAN SECTION      OB History  Gravida Para Term Preterm AB Living  5 2 2   2 2   SAB IAB Ectopic Multiple Live Births  2     0 2    # Outcome Date GA Lbr Len/2nd Weight Sex Delivery Anes PTL Lv  5 Current           4 Term 11/22/21 [redacted]w[redacted]d / 01:05 4460 g M VBAC EPI, Local  LIV  3 SAB           2 SAB           1 Term             Social History   Socioeconomic History   Marital status: Single    Spouse name: Not on file   Number of children: Not on file   Years of education: Not on file   Highest education level: Not on file  Occupational History   Not on file  Tobacco Use   Smoking status: Never   Smokeless tobacco: Never  Vaping Use   Vaping Use: Never used  Substance and Sexual Activity   Alcohol use: Not Currently   Drug use: Not Currently   Sexual activity: Yes    Birth control/protection: Other-see comments    Comment: Undecided  Other Topics Concern   Not on file  Social History Narrative   Not on file   Social Determinants of Health   Financial Resource Strain: Not on file  Food Insecurity: Not on file  Transportation Needs: Not on file  Physical Activity: Not on file  Stress: Not on file  Social Connections: Not on file    History reviewed. No pertinent family history.  Allergies   Allergen Reactions   Motrin [Ibuprofen] Hives   Sulfa Antibiotics Hives    Medications Prior to Admission  Medication Sig Dispense Refill Last Dose   doxylamine, Sleep, (UNISOM) 25 MG tablet Take 1 tablet (25 mg total) by mouth at bedtime as needed. 30 tablet 0 Past Week   ondansetron (ZOFRAN-ODT) 4 MG disintegrating tablet Take 1 tablet by mouth daily as needed.    at prn   acetaminophen (TYLENOL) 325 MG tablet Take 2 tablets (650 mg total) by mouth every 4 (four) hours as needed (for pain scale < 4  OR  temperature  >/=  100.5 F). 90 tablet 1  at prn   albuterol (VENTOLIN HFA) 108 (90 Base) MCG/ACT inhaler Inhale 2 puffs into the lungs every 6 (six) hours as needed for wheezing or shortness of breath. 8 g 2  at prn   docusate sodium (COLACE) 100 MG capsule Take 1 capsule (100 mg total) by mouth 2 (two) times daily. 10 capsule 0  at prn   famotidine (PEPCID)  20 MG tablet Take 1 tablet (20 mg total) by mouth 2 (two) times daily. 60 tablet 0  at prn   Iron-FA-B Cmp-C-Biot-Probiotic (FUSION PLUS) CAPS Take 1 tablet by mouth daily. (Patient not taking: Reported on 04/17/2022) 30 capsule 9 Not Taking   metoCLOPramide (REGLAN) 10 MG tablet Take 1 tablet (10 mg total) by mouth 3 (three) times daily with meals. 90 tablet 0  at prn   Prenatal Vit-Fe Fumarate-FA (PRENATAL VITAMINS PO) Take by mouth. (Patient not taking: Reported on 04/17/2022)   Not Taking   pyridOXINE (PYRIDOXINE) 25 MG tablet Take 1 tablet (25 mg total) by mouth in the morning, at noon, and at bedtime. (Patient not taking: Reported on 04/17/2022) 90 tablet 0 Not Taking    Review of Systems - Negative except as mentioned in HPI  Vitals:  BP 109/62 (BP Location: Left Arm)   Pulse 80   Temp 98 F (36.7 C) (Oral)   Resp 18   Ht 5\' 7"  (1.702 m)   Wt 63.5 kg   LMP 02/17/2022   SpO2 100%   Breastfeeding No   BMI 21.93 kg/m  Physical Examination: CONSTITUTIONAL: Well-developed, well-nourished female in no acute distress.  HENT:   Normocephalic, atraumatic, External right and left ear normal. Oropharynx is clear and moist EYES: Conjunctivae and EOM are normal. Pupils are equal, round, and reactive to light. No scleral icterus.  NECK: Normal range of motion, supple, no masses SKIN: Skin is warm and dry. No rash noted. Not diaphoretic. No erythema. No pallor. NEUROLOGIC: Alert and oriented to person, place, and time. Normal reflexes, muscle tone coordination. No cranial nerve deficit noted. PSYCHIATRIC: Normal mood and affect. Normal behavior. Normal judgment and thought content. CARDIOVASCULAR: Normal heart rate noted, regular rhythm RESPIRATORY: Effort and breath sounds normal, no problems with respiration noted ABDOMEN: Soft, nontender, nondistended, gravid. MUSCULOSKELETAL: Normal range of motion. No edema and no tenderness. 2+ distal pulses.  Cervix: Not evaluated.  Membranes:intact Fetal Monitoring: n/a  Tocometer: n/a   Labs:  Results for orders placed or performed during the hospital encounter of 04/17/22 (from the past 24 hour(s))  Lipase, blood   Collection Time: 04/17/22 11:52 AM  Result Value Ref Range   Lipase 24 11 - 51 U/L  Comprehensive metabolic panel   Collection Time: 04/17/22 11:52 AM  Result Value Ref Range   Sodium 136 135 - 145 mmol/L   Potassium 3.2 (L) 3.5 - 5.1 mmol/L   Chloride 105 98 - 111 mmol/L   CO2 15 (L) 22 - 32 mmol/L   Glucose, Bld 76 70 - 99 mg/dL   BUN 6 6 - 20 mg/dL   Creatinine, Ser 04/19/22 0.44 - 1.00 mg/dL   Calcium 9.3 8.9 - 2.58 mg/dL   Total Protein 8.1 6.5 - 8.1 g/dL   Albumin 4.5 3.5 - 5.0 g/dL   AST 91 (H) 15 - 41 U/L   ALT 179 (H) 0 - 44 U/L   Alkaline Phosphatase 82 38 - 126 U/L   Total Bilirubin 1.9 (H) 0.3 - 1.2 mg/dL   GFR, Estimated 52.7 >78 mL/min   Anion gap 16 (H) 5 - 15  CBC   Collection Time: 04/17/22 11:52 AM  Result Value Ref Range   WBC 8.5 4.0 - 10.5 K/uL   RBC 5.37 (H) 3.87 - 5.11 MIL/uL   Hemoglobin 15.0 12.0 - 15.0 g/dL   HCT 04/19/22 (H)  23.5 - 46.0 %   MCV 86.4 80.0 - 100.0 fL   MCH  27.9 26.0 - 34.0 pg   MCHC 32.3 30.0 - 36.0 g/dL   RDW 29.5 62.1 - 30.8 %   Platelets 258 150 - 400 K/uL   nRBC 0.0 0.0 - 0.2 %  hCG, quantitative, pregnancy   Collection Time: 04/17/22 11:52 AM  Result Value Ref Range   hCG, Beta Chain, Quant, S 119,400 (H) <5 mIU/mL  Lactic acid, plasma   Collection Time: 04/17/22 12:23 PM  Result Value Ref Range   Lactic Acid, Venous 1.4 0.5 - 1.9 mmol/L  Beta-hydroxybutyric acid   Collection Time: 04/17/22 12:23 PM  Result Value Ref Range   Beta-Hydroxybutyric Acid 4.51 (H) 0.05 - 0.27 mmol/L  CK   Collection Time: 04/17/22 12:23 PM  Result Value Ref Range   Total CK 53 38 - 234 U/L  Blood gas, venous   Collection Time: 04/17/22 12:23 PM  Result Value Ref Range   pH, Ven 7.32 7.25 - 7.43   pCO2, Ven 30 (L) 44 - 60 mmHg   pO2, Ven 40 32 - 45 mmHg   Bicarbonate 15.5 (L) 20.0 - 28.0 mmol/L   Acid-base deficit 9.3 (H) 0.0 - 2.0 mmol/L   O2 Saturation 68.2 %   Patient temperature 37.0    Collection site VEIN   Bilirubin, direct   Collection Time: 04/17/22 12:23 PM  Result Value Ref Range   Bilirubin, Direct 0.5 (H) 0.0 - 0.2 mg/dL  Magnesium   Collection Time: 04/17/22 12:40 PM  Result Value Ref Range   Magnesium 2.1 1.7 - 2.4 mg/dL    Imaging Studies: US ABDOMEN LIMITED RUQ (LIVER/GB)  Result Date: 04/17/2022 CLINICAL DATA:  Abdominal pain for 1 week. EXAM: ULTRASOUND ABDOMEN LIMITED RIGHT UPPER QUADRANT COMPARISON:  04/12/2022 FINDINGS: Gallbladder: Echogenic sludge is seen nearly filling the gallbladder which limits evaluation, however no definite shadowing gallstones are identified. No evidence of gallbladder wall thickening or pericholecystic fluid. No sonographic Murphy sign noted by sonographer. Common bile duct: Diameter: 2 mm, within normal limits. Liver: No focal lesion identified. Within normal limits in parenchymal echogenicity. Portal vein is patent on color Doppler imaging with  normal direction of blood flow towards the liver. Other: None. IMPRESSION: Gallbladder sludge, without definite gallstones identified. No evidence of cholecystitis or biliary ductal dilatation. Electronically Signed   By: Danae Orleans M.D.   On: 04/17/2022 14:08   US Abdomen Limited RUQ (LIVER/GB)  Result Date: 04/12/2022 CLINICAL DATA:  Vomiting, elevated LFTs EXAM: ULTRASOUND ABDOMEN LIMITED RIGHT UPPER QUADRANT COMPARISON:  None Available. FINDINGS: Gallbladder: Contains a large amount of echogenic material with mild shadowing, likely sludge and small stones. No wall thickening or pericholecystic edema. Negative sonographic Murphy's sign. Common bile duct: Diameter: 1 mm Liver: No focal lesion identified. Within normal limits in parenchymal echogenicity. Portal vein is patent on color Doppler imaging with normal direction of blood flow towards the liver. Other: None. IMPRESSION: Gallbladder sludge and stones. Electronically Signed   By: Jannifer Hick M.D.   On: 04/12/2022 14:26   US OB LESS THAN 14 WEEKS WITH OB TRANSVAGINAL  Result Date: 04/09/2022 CLINICAL DATA:  Abdominal pain x4 days. EXAM: OBSTETRIC <14 WK [place] AND TRANSVAGINAL OB US TECHNIQUE: Both transabdominal and transvaginal ultrasound examinations were performed for complete evaluation of the gestation as well as the maternal uterus, adnexal regions, and pelvic cul-de-sac. Transvaginal technique was performed to assess early pregnancy. COMPARISON:  None Available. FINDINGS: Intrauterine gestational sac: Single Yolk sac:  Visualized. Embryo:  Visualized. Cardiac Activity: Visualized. Heart Rate: 105 bpm  CRL:  4.3 mm   6 w   1 d                  [place] EDC: December 02, 2022 Subchorionic hemorrhage:  Small (2.2 cm x 0.7 cm x 1.1 cm) Maternal uterus/adnexae: Small corpus luteum cysts are seen within both ovaries. There is a small amount of pelvic free fluid. IMPRESSION: Single, viable intrauterine pregnancy at approximately 6 weeks and 1 day  gestation by ultrasound evaluation. Electronically Signed   By: Aram Candela M.D.   On: 04/09/2022 02:12     Assessment and Plan: Patient Active Problem List   Diagnosis Date Noted   Hyperemesis 04/17/2022   Labor and delivery, indication for care 11/22/2021   Asthma 05/15/2001   Admit to Antenatal Betamethasone not indicated Banana bag x 1, followed by D5LR 20 mEq potassium chloride @150  mg/hr.Antiemetics per order, gut rest through the night.  Routine antenatal care  , CNM

## 2022-04-17 NOTE — ED Notes (Signed)
SST drawn using butterfly at L ac sent to lab as requested; IV wouldn't draw back enough blood for tube collection.

## 2022-04-17 NOTE — ED Triage Notes (Signed)
Patient c/o emesis during pregnancy. Reports she is 7 weeks

## 2022-04-17 NOTE — ED Notes (Signed)
Linda RN aware of assigned bed 

## 2022-04-18 DIAGNOSIS — Z3A01 Less than 8 weeks gestation of pregnancy: Secondary | ICD-10-CM

## 2022-04-18 DIAGNOSIS — O210 Mild hyperemesis gravidarum: Secondary | ICD-10-CM

## 2022-04-18 LAB — URINALYSIS, ROUTINE W REFLEX MICROSCOPIC
Bilirubin Urine: NEGATIVE
Glucose, UA: >=500 mg/dL — AB
Hgb urine dipstick: NEGATIVE
Ketones, ur: 80 mg/dL — AB
Nitrite: NEGATIVE
Protein, ur: NEGATIVE mg/dL
Specific Gravity, Urine: 1.007 (ref 1.005–1.030)
pH: 6 (ref 5.0–8.0)

## 2022-04-18 LAB — HEPATITIS PANEL, ACUTE
HCV Ab: NONREACTIVE
Hep A IgM: NONREACTIVE
Hep B C IgM: NONREACTIVE
Hepatitis B Surface Ag: NONREACTIVE

## 2022-04-18 MED ORDER — DOCUSATE SODIUM 100 MG PO CAPS
100.0000 mg | ORAL_CAPSULE | Freq: Every day | ORAL | Status: DC
  Administered 2022-04-18 – 2022-04-19 (×2): 100 mg via ORAL
  Filled 2022-04-18 (×2): qty 1

## 2022-04-18 MED ORDER — BOOST / RESOURCE BREEZE PO LIQD CUSTOM: 1.0000 | Freq: Two times a day (BID) | ORAL | Status: DC

## 2022-04-18 NOTE — Progress Notes (Signed)
April Olsen is a 21 yo Gravida 5 para 2, presently 7 weeks 4 days gestation, admitted on 04/17/2022 with hyperemesis gravidarum. She has been treated with IV fluid hydration, antiemetics and vitamin supplementation (via IV). As she has not vomited today, she has been provided clear liquids, and per the RN, tolerated these without vomiting.  Subjective: Patient reports tolerating PO and no problems voiding.  She shares that she is hungry. Requesting some additional clear liquids. Is voiding. Has not had a bowel movement in over a week, per her report.  Objective: I have reviewed patient's vital signs, intake and output, medications, and labs.  General: cooperative and no distress BP (!) 96/58 (BP Location: Left Arm)   Pulse 62   Temp 98.1 F (36.7 C) (Oral)   Resp 18   Ht 5\' 7"  (1.702 m)   Wt 72.9 kg   LMP 02/17/2022   SpO2 100%   Breastfeeding No   BMI 25.17 kg/m   Results for orders placed or performed during the hospital encounter of 04/17/22 (from the past 24 hour(s))  Urinalysis, Routine w reflex microscopic Urine, Clean Catch     Status: Abnormal   Collection Time: 04/18/22 12:58 AM  Result Value Ref Range   Color, Urine YELLOW (A) YELLOW   APPearance HAZY (A) CLEAR   Specific Gravity, Urine 1.007 1.005 - 1.030   pH 6.0 5.0 - 8.0   Glucose, UA >=500 (A) NEGATIVE mg/dL   Hgb urine dipstick NEGATIVE NEGATIVE   Bilirubin Urine NEGATIVE NEGATIVE   Ketones, ur 80 (A) NEGATIVE mg/dL   Protein, ur NEGATIVE NEGATIVE mg/dL   Nitrite NEGATIVE NEGATIVE   Leukocytes,Ua LARGE (A) NEGATIVE   RBC / HPF 0-5 0 - 5 RBC/hpf   WBC, UA 21-50 0 - 5 WBC/hpf   Bacteria, UA FEW (A) NONE SEEN   Squamous Epithelial / LPF 11-20 0 - 5   Mucus PRESENT    Amorphous Crystal PRESENT     Physical Exam Constitutional:      Appearance: Normal appearance. She is normal weight.  HENT:     Head: Normocephalic and atraumatic.  Cardiovascular:     Rate and Rhythm: Normal rate and regular rhythm.     Pulses:  Normal pulses.     Heart sounds: Normal heart sounds.  Pulmonary:     Effort: Pulmonary effort is normal.     Breath sounds: Normal breath sounds.  Abdominal:     General: Abdomen is flat.     Palpations: Abdomen is soft.     Comments: + bowel sounds, no tenderness to palpation  Genitourinary:    Comments: Exam deferred Musculoskeletal:        General: Normal range of motion.     Cervical back: Normal range of motion and neck supple.  Skin:    General: Skin is warm and dry.  Neurological:     General: No focal deficit present.     Mental Status: She is oriented to person, place, and time.  Psychiatric:        Mood and Affect: Mood normal.        Behavior: Behavior normal.       Assessment/Plan: IUP 7 weeks 4 days gestation Hyperemesis gravidarum  Plan- continue current orders. Colace added daily Will advance to full liquids. Repeat CBC and CMP in the morning. If she tolerates Full liquids, will advance diet in the morning.  LOS: 1 day    04/20/22, CNM 04/18/2022, 6:22 PM

## 2022-04-18 NOTE — Progress Notes (Signed)
Initial Nutrition Assessment  INTERVENTION:   -Advance diet as tolerated to GI soft (low fat, low fiber)  -Boost Breeze po BID, each supplement provides 250 kcal and 9 grams of protein   -If diet unable to be tolerated, consider initiation of enteral nutrition support  NUTRITION DIAGNOSIS:   Increased nutrient needs related to acute illness (hyperemesis) as evidenced by estimated needs.  GOAL:   Patient will meet greater than or equal to 90% of their needs  MONITOR:   PO intake, Supplement acceptance, Labs, Weight trends, I & O's  REASON FOR ASSESSMENT:   Malnutrition Screening Tool    ASSESSMENT:   21 y.o.patient, 7 weeks 2 days pregnant with complaint of nausea and vomiting 8-10x daily for several days. Admitted for hyperemesis.  Patient unavailable at time of calling room. RD working remotely. Per chart review, pt has been having vomiting for 2 weeks now. Was taking prenatal vitamins. Has taken motion sickness medication, Vitamin B-6 and Unisom but these did not help.  Pt currently on clear liquids. Will order Boost Breeze to provide some additional kcal and protein while on liquid diet, if pt can tolerate.  Pre-pregnancy weight unknown at this time. Will need to gather at follow-up.  Current weight: 160 lbs. Gave birth in February 2023.   Medications: Colace, Pepcid, Prenatal MVI, D5 infusion in lactated ringers, IV Zofran, Tums, Simethicone  Labs reviewed:  Low K  NUTRITION - FOCUSED PHYSICAL EXAM:  Unable to complete, RD working remotely  Diet Order:   Diet Order             Diet clear liquid Room service appropriate? Yes; Fluid consistency: Thin  Diet effective now                   EDUCATION NEEDS:   Not appropriate for education at this time  Skin:  Skin Assessment: Reviewed RN Assessment  Last BM:  7/7  Height:   Ht Readings from Last 1 Encounters:  04/17/22 5\' 7"  (1.702 m)    Weight:   Wt Readings from Last 1 Encounters:   04/18/22 72.9 kg    BMI:  Body mass index is 25.17 kg/m.  Estimated Nutritional Needs:   Kcal:  1800-2000  Protein:  80-90g  Fluid:  2L/day  04/20/22, MS, RD, LDN Inpatient Clinical Dietitian Contact information available via Amion

## 2022-04-19 DIAGNOSIS — O210 Mild hyperemesis gravidarum: Secondary | ICD-10-CM

## 2022-04-19 DIAGNOSIS — Z3A01 Less than 8 weeks gestation of pregnancy: Secondary | ICD-10-CM

## 2022-04-19 LAB — CBC WITH DIFFERENTIAL/PLATELET
Abs Immature Granulocytes: 0.02 10*3/uL (ref 0.00–0.07)
Basophils Absolute: 0 10*3/uL (ref 0.0–0.1)
Basophils Relative: 0 %
Eosinophils Absolute: 0.2 10*3/uL (ref 0.0–0.5)
Eosinophils Relative: 4 %
HCT: 37.5 % (ref 36.0–46.0)
Hemoglobin: 12.3 g/dL (ref 12.0–15.0)
Immature Granulocytes: 0 %
Lymphocytes Relative: 41 %
Lymphs Abs: 2.2 10*3/uL (ref 0.7–4.0)
MCH: 28.1 pg (ref 26.0–34.0)
MCHC: 32.8 g/dL (ref 30.0–36.0)
MCV: 85.8 fL (ref 80.0–100.0)
Monocytes Absolute: 0.5 10*3/uL (ref 0.1–1.0)
Monocytes Relative: 10 %
Neutro Abs: 2.4 10*3/uL (ref 1.7–7.7)
Neutrophils Relative %: 45 %
Platelets: 195 10*3/uL (ref 150–400)
RBC: 4.37 MIL/uL (ref 3.87–5.11)
RDW: 13.7 % (ref 11.5–15.5)
WBC: 5.3 10*3/uL (ref 4.0–10.5)
nRBC: 0 % (ref 0.0–0.2)

## 2022-04-19 LAB — COMPREHENSIVE METABOLIC PANEL
ALT: 152 U/L — ABNORMAL HIGH (ref 0–44)
AST: 83 U/L — ABNORMAL HIGH (ref 15–41)
Albumin: 3.3 g/dL — ABNORMAL LOW (ref 3.5–5.0)
Alkaline Phosphatase: 59 U/L (ref 38–126)
Anion gap: 3 — ABNORMAL LOW (ref 5–15)
BUN: <5 mg/dL — ABNORMAL LOW (ref 6–20)
CO2: 22 mmol/L (ref 22–32)
Calcium: 8.6 mg/dL — ABNORMAL LOW (ref 8.9–10.3)
Chloride: 110 mmol/L (ref 98–111)
Creatinine, Ser: 0.44 mg/dL (ref 0.44–1.00)
GFR, Estimated: >60 mL/min (ref >60)
Glucose, Bld: 107 mg/dL — ABNORMAL HIGH (ref 70–99)
Potassium: 3.6 mmol/L (ref 3.5–5.1)
Sodium: 135 mmol/L (ref 135–145)
Total Bilirubin: 0.6 mg/dL (ref 0.3–1.2)
Total Protein: 5.9 g/dL — ABNORMAL LOW (ref 6.5–8.1)

## 2022-04-19 MED ORDER — PROMETHAZINE HCL 25 MG PO TABS: 25.0000 mg | ORAL_TABLET | Freq: Four times a day (QID) | ORAL | 0 refills | Status: AC | PRN

## 2022-04-19 MED ORDER — DOCUSATE SODIUM 100 MG PO CAPS: 100.0000 mg | ORAL_CAPSULE | Freq: Every day | ORAL | 0 refills | Status: AC | PRN

## 2022-04-19 MED ORDER — ONDANSETRON 4 MG PO TBDP: 4.0000 mg | ORAL_TABLET | Freq: Three times a day (TID) | ORAL | 0 refills | Status: AC | PRN

## 2022-04-19 MED ORDER — PRENATAL MULTIVITAMIN CH: 1.0000 | ORAL_TABLET | Freq: Every day | ORAL | Status: AC

## 2022-04-19 NOTE — Discharge Summary (Signed)
Obstetrical Discharge Summary  Date of Admission: 04/17/2022 Date of Discharge: 04/19/2022  Primary OB: [facility]  Gestational Age at Delivery: [redacted]w[redacted]d   Antepartum complications: Hyperemesis  Reason for Admission: Hyperemesis  Discharge Diagnosis: Hyperemesis   Hospital course: Pt was admitted for hyperemesis.  She received IV fluids, antinausea medications. and vitamin supplementation. She declined Pepcid.  Over time she was able to tolerate an advanced diet.  She did have some nausea after her evening meal, but has not had any vomiting for over 24 hours. Pt desires to go home. Reviewed nausea and vomiting in pregnancy, pt comfortable going home on PRN medications.   Pt's LFT's are noted to be elevated.  She was evaluated  in the ED on 7/8.  During that visit her LFT's were elevated: imaging showed a normal liver and gallbladder sludge but no gallstones, her Hep A, B, and C were negative.  Reviewed findings with Dr Valentino Saxon.  No further testing warranted at this time.  Will continue to monitor LFTs throughout pregnancy.   Discharge Vital Signs:  Current Vital Signs 24h Vital Sign Ranges  T 98.4 F (36.9 C) Temp  Avg: 98.2 F (36.8 C)  Min: 98.1 F (36.7 C)  Max: 98.4 F (36.9 C)  BP (!) 100/55 BP  Min: 90/54  Max: 106/67  HR 82 Pulse  Avg: 72.9  Min: 64  Max: 82  RR 18 Resp  Avg: 18.3  Min: 16  Max: 20  SaO2 98 % Room Air SpO2  Avg: 98.8 %  Min: 96 %  Max: 100 %       24 Hour I/O Current Shift I/O  Time Ins Outs 07/14 0701 - 07/15 0700 In: 4792.6 [P.O.:1440; I.V.:3302.6] Out: 1900 [Urine:1900] 07/15 1901 - 07/16 0700 In: 240 [P.O.:240] Out: -   Discharge Exam:  NAD Perineum: deferred Abdomen: non tendeRRR no MRGs CTAB Ext: no c/c/e  Recent Labs  Lab 04/17/22 1152 04/19/22 0721  WBC 8.5 5.3  HGB 15.0 12.3  HCT 46.4* 37.5  PLT 258 195    Disposition: Home   Plan:  April Olsen was discharged to home in good condition. Follow-up appointment with [facility] in 1 to  2  weeks for a NOB visit  No future appointments.  Discharge Medications: Allergies as of 04/19/2022       Reactions   Eggs Or Egg-derived Products Anaphylaxis   Throat closes up   Peanut-containing Drug Products Anaphylaxis   Throat closes up.   Motrin [ibuprofen] Hives   Sulfa Antibiotics Hives        Medication List     STOP taking these medications    famotidine 20 MG tablet Commonly known as: PEPCID   Fusion Plus Caps   metoCLOPramide 10 MG tablet Commonly known as: REGLAN   PRENATAL VITAMINS PO   vitamin B-6 25 MG tablet Commonly known as: pyridOXINE       TAKE these medications    acetaminophen 325 MG tablet Commonly known as: TYLENOL Take 2 tablets (650 mg total) by mouth every 4 (four) hours as needed (for pain scale < 4  OR  temperature  >/=  100.5 F).   albuterol 108 (90 Base) MCG/ACT inhaler Commonly known as: VENTOLIN HFA Inhale 2 puffs into the lungs every 6 (six) hours as needed for wheezing or shortness of breath.   docusate sodium 100 MG capsule Commonly known as: COLACE Take 1 capsule (100 mg total) by mouth daily as needed for mild constipation. What changed:  when  to take this reasons to take this   doxylamine (Sleep) 25 MG tablet Commonly known as: UNISOM Take 1 tablet (25 mg total) by mouth at bedtime as needed.   ondansetron 4 MG disintegrating tablet Commonly known as: ZOFRAN-ODT Take 1 tablet (4 mg total) by mouth every 8 (eight) hours as needed for nausea or vomiting. What changed:  when to take this reasons to take this   prenatal multivitamin Tabs tablet Take 1 tablet by mouth daily at 12 noon. Start taking on: April 20, 2022   promethazine 25 MG tablet Commonly known as: PHENERGAN Take 1 tablet (25 mg total) by mouth every 6 (six) hours as needed for nausea or vomiting (take only if you continue to have N/V  after taking Zofran and 2 hours have gone by).       Carie Caddy, CNM  [facility], [facility]  @TODAY @  7:58 PM

## 2022-04-19 NOTE — Progress Notes (Signed)
SUBJECTIVE: Pt sitting up in bed.  Was able to eat breakfast, but only ate a little as the food was unappealing. Has not had any nausea since last night.  Desires to go home when able.   OBJECTIVE: BP (!) 93/59 (BP Location: Right Arm) Comment: nurse Pattricia Boss notified  Pulse 72   Temp 98.1 F (36.7 C) (Oral)   Resp 18   Ht 5\' 7"  (1.702 m)   Wt 73.4 kg   LMP 02/17/2022   SpO2 100%   Breastfeeding No   BMI 25.34 kg/m  GEN: NAD Lungs: breathing without difficulty.  ASSESSMENT/PLAN: Hyperemesis -Will consider discharge later today as long as pt tolerates PO meals  02/19/2022, CNM  Carie Caddy, [facility]  @TODAY @  11:25 AM  '

## 2022-04-19 NOTE — Progress Notes (Signed)
Patient discharged home. Benedetto Goad picked patient up at ED entrance. Discharge instructions and prescriptions given and reviewed with patient. Patient verbalized understanding.  Explained picking up medications at [facility] pharmacy in the AM due to them being closed tonight.   Encouraged patient to call and schedule a NOB appointment at [facility] in 1-2 weeks.   Escorted out by staff.

## 2022-04-20 ENCOUNTER — Emergency Department
Admission: EM | Admit: 2022-04-20 | Discharge: 2022-04-21 | Disposition: A | Discharge status: Home / Self Care | Payer: Medicaid Other | Attending: Emergency Medicine | Admitting: Emergency Medicine

## 2022-04-20 ENCOUNTER — Other Ambulatory Visit: Payer: Self-pay

## 2022-04-20 ENCOUNTER — Encounter: Payer: Self-pay | Admitting: Emergency Medicine

## 2022-04-20 DIAGNOSIS — O99511 Diseases of the respiratory system complicating pregnancy, first trimester: Secondary | ICD-10-CM | POA: Insufficient documentation

## 2022-04-20 DIAGNOSIS — O99111 Other diseases of the blood and blood-forming organs and certain disorders involving the immune mechanism complicating pregnancy, first trimester: Secondary | ICD-10-CM | POA: Insufficient documentation

## 2022-04-20 DIAGNOSIS — E876 Hypokalemia: Secondary | ICD-10-CM | POA: Insufficient documentation

## 2022-04-20 DIAGNOSIS — Z91010 Allergy to peanuts: Secondary | ICD-10-CM | POA: Insufficient documentation

## 2022-04-20 DIAGNOSIS — R000 Tachycardia, unspecified: Secondary | ICD-10-CM | POA: Insufficient documentation

## 2022-04-20 DIAGNOSIS — Z20822 Contact with and (suspected) exposure to covid-19: Secondary | ICD-10-CM | POA: Diagnosis not present

## 2022-04-20 DIAGNOSIS — J45901 Unspecified asthma with (acute) exacerbation: Secondary | ICD-10-CM | POA: Insufficient documentation

## 2022-04-20 DIAGNOSIS — O99281 Endocrine, nutritional and metabolic diseases complicating pregnancy, first trimester: Secondary | ICD-10-CM | POA: Diagnosis not present

## 2022-04-20 DIAGNOSIS — D72829 Elevated white blood cell count, unspecified: Secondary | ICD-10-CM | POA: Diagnosis not present

## 2022-04-20 DIAGNOSIS — Z3A01 Less than 8 weeks gestation of pregnancy: Secondary | ICD-10-CM | POA: Diagnosis not present

## 2022-04-20 DIAGNOSIS — O26891 Other specified pregnancy related conditions, first trimester: Secondary | ICD-10-CM | POA: Diagnosis present

## 2022-04-20 LAB — CBC WITH DIFFERENTIAL/PLATELET
Abs Immature Granulocytes: 0.03 10*3/uL (ref 0.00–0.07)
Basophils Absolute: 0 10*3/uL (ref 0.0–0.1)
Basophils Relative: 0 %
Eosinophils Absolute: 0.3 10*3/uL (ref 0.0–0.5)
Eosinophils Relative: 2 %
HCT: 45.7 % (ref 36.0–46.0)
Hemoglobin: 14.8 g/dL (ref 12.0–15.0)
Immature Granulocytes: 0 %
Lymphocytes Relative: 22 %
Lymphs Abs: 2.7 10*3/uL (ref 0.7–4.0)
MCH: 27.9 pg (ref 26.0–34.0)
MCHC: 32.4 g/dL (ref 30.0–36.0)
MCV: 86.1 fL (ref 80.0–100.0)
Monocytes Absolute: 0.8 10*3/uL (ref 0.1–1.0)
Monocytes Relative: 6 %
Neutro Abs: 8.3 10*3/uL — ABNORMAL HIGH (ref 1.7–7.7)
Neutrophils Relative %: 70 %
Platelets: 238 10*3/uL (ref 150–400)
RBC: 5.31 MIL/uL — ABNORMAL HIGH (ref 3.87–5.11)
RDW: 13.9 % (ref 11.5–15.5)
WBC: 12.1 10*3/uL — ABNORMAL HIGH (ref 4.0–10.5)
nRBC: 0 % (ref 0.0–0.2)

## 2022-04-20 LAB — COMPREHENSIVE METABOLIC PANEL
ALT: 116 U/L — ABNORMAL HIGH (ref 0–44)
AST: 41 U/L (ref 15–41)
Albumin: 4 g/dL (ref 3.5–5.0)
Alkaline Phosphatase: 73 U/L (ref 38–126)
Anion gap: 9 (ref 5–15)
BUN: 5 mg/dL — ABNORMAL LOW (ref 6–20)
CO2: 24 mmol/L (ref 22–32)
Calcium: 8.9 mg/dL (ref 8.9–10.3)
Chloride: 106 mmol/L (ref 98–111)
Creatinine, Ser: 0.67 mg/dL (ref 0.44–1.00)
GFR, Estimated: >60 mL/min (ref >60)
Glucose, Bld: 158 mg/dL — ABNORMAL HIGH (ref 70–99)
Potassium: 3.3 mmol/L — ABNORMAL LOW (ref 3.5–5.1)
Sodium: 139 mmol/L (ref 135–145)
Total Bilirubin: 0.6 mg/dL (ref 0.3–1.2)
Total Protein: 6.9 g/dL (ref 6.5–8.1)

## 2022-04-20 LAB — URINALYSIS, MICROSCOPIC (REFLEX): Bacteria, UA: NONE SEEN

## 2022-04-20 LAB — URINALYSIS, ROUTINE W REFLEX MICROSCOPIC
Bilirubin Urine: NEGATIVE
Glucose, UA: NEGATIVE mg/dL
Hgb urine dipstick: NEGATIVE
Nitrite: NEGATIVE
Protein, ur: NEGATIVE mg/dL
Specific Gravity, Urine: 1.025 (ref 1.005–1.030)
pH: 7.5 (ref 5.0–8.0)

## 2022-04-20 LAB — RESP PANEL BY RT-PCR (FLU A&B, COVID) ARPGX2
Influenza A by PCR: NEGATIVE
Influenza B by PCR: NEGATIVE
SARS Coronavirus 2 by RT PCR: NEGATIVE

## 2022-04-20 LAB — TROPONIN I (HIGH SENSITIVITY): Troponin I (High Sensitivity): 6 ng/L (ref <18)

## 2022-04-20 LAB — POC URINE PREG, ED: Preg Test, Ur: POSITIVE — AB

## 2022-04-20 MED ORDER — ALBUTEROL SULFATE (2.5 MG/3ML) 0.083% IN NEBU
2.5000 mg | INHALATION_SOLUTION | Freq: Once | RESPIRATORY_TRACT | Status: AC
  Administered 2022-04-20: 2.5 mg via RESPIRATORY_TRACT
  Filled 2022-04-20: qty 3

## 2022-04-20 MED ORDER — METHYLPREDNISOLONE SODIUM SUCC 125 MG IJ SOLR
125.0000 mg | Freq: Once | INTRAMUSCULAR | Status: AC
  Administered 2022-04-20: 125 mg via INTRAVENOUS
  Filled 2022-04-20: qty 2

## 2022-04-20 MED ORDER — SODIUM CHLORIDE 0.9 % IV BOLUS
1000.0000 mL | Freq: Once | INTRAVENOUS | Status: AC
  Administered 2022-04-20: 1000 mL via INTRAVENOUS

## 2022-04-20 NOTE — ED Provider Notes (Signed)
[facility] Provider Note    Event Date/Time   First MD Initiated Contact with Patient 04/20/22 2304     (approximate)   History   Shortness of Breath and Chest Pain   HPI  April Olsen is a 21 y.o. female who presents to the ED via EMS from home with a chief complaint of asthma exacerbation.  Patient has a history of childhood asthma who was recently hospitalized and discharged for hyperemesis gravidarum; is currently [redacted] weeks pregnant.  States as soon as she got home from the hospital last evening she took and ODT Zofran and began wheezing.  She has had Zofran through her IV but never ODT.  States her wheezing lasted throughout the day associated with chest tightness.  Denies fever, chills, cough, abdominal pain, dysuria or diarrhea.     Past Medical History   Past Medical History:  Diagnosis Date  . Anemia   . Asthma      Active Problem List   Patient Active Problem List   Diagnosis Date Noted  . Hyperemesis 04/17/2022  . Labor and delivery, indication for care 11/22/2021  . Asthma 05/15/2001     Past Surgical History   Past Surgical History:  Procedure Laterality Date  . CESAREAN SECTION       Home Medications   Prior to Admission medications   Medication Sig Start Date End Date Taking? Authorizing Provider  acetaminophen (TYLENOL) 325 MG tablet Take 2 tablets (650 mg total) by mouth every 4 (four) hours as needed (for pain scale < 4  OR  temperature  >/=  100.5 F). 11/23/21   Doreene Burke, CNM  albuterol (VENTOLIN HFA) 108 (90 Base) MCG/ACT inhaler Inhale 2 puffs into the lungs every 6 (six) hours as needed for wheezing or shortness of breath. 03/10/21   Tommi Rumps, PA-C  docusate sodium (COLACE) 100 MG capsule Take 1 capsule (100 mg total) by mouth daily as needed for mild constipation. 04/19/22   Dominic, Courtney Heys, CNM  doxylamine, Sleep, (UNISOM) 25 MG tablet Take 1 tablet (25 mg total) by mouth at bedtime as needed.  04/09/22   Georga Hacking, MD  ondansetron (ZOFRAN-ODT) 4 MG disintegrating tablet Take 1 tablet (4 mg total) by mouth every 8 (eight) hours as needed for nausea or vomiting. 04/19/22 05/19/22  Dominic, Courtney Heys, CNM  Prenatal Vit-Fe Fumarate-FA (PRENATAL MULTIVITAMIN) TABS tablet Take 1 tablet by mouth daily at 12 noon. 04/20/22   Dominic, Courtney Heys, CNM  promethazine (PHENERGAN) 25 MG tablet Take 1 tablet (25 mg total) by mouth every 6 (six) hours as needed for nausea or vomiting (take only if you continue to have N/V  after taking Zofran and 2 hours have gone by). 04/19/22   Dominic, Courtney Heys, CNM     Allergies  Eggs or egg-derived products, Peanut-containing drug products, Motrin [ibuprofen], and Sulfa antibiotics   Family History  History reviewed. No pertinent family history.   Physical Exam  Triage Vital Signs: ED Triage Vitals  Enc Vitals Group     BP 04/20/22 1725 106/63     Pulse Rate 04/20/22 1725 (!) 148     Resp 04/20/22 1725 16     Temp 04/20/22 1725 98.2 F (36.8 C)     Temp Source 04/20/22 1957 Oral     SpO2 04/20/22 1725 96 %     Weight 04/20/22 1742 160 lb (72.6 kg)     Height 04/20/22 1742 5\' 6"  (1.676 m)  Head Circumference --      Peak Flow --      Pain Score 04/20/22 1740 5     Pain Loc --      Pain Edu? --      Excl. in GC? --     Updated Vital Signs: BP 111/79   Pulse 96   Temp 98.4 F (36.9 C) (Oral)   Resp 17   Ht 5\' 6"  (1.676 m)   Wt 72.6 kg   LMP 02/17/2022   SpO2 97%   BMI 25.82 kg/m   Patient examined after IV Solu-Medrol and albuterol nebulizer: General: Awake, no distress.  CV:  Mild tachycardia.  Good peripheral perfusion.  Resp:  Normal effort.  CTAB; no wheezing. Abd:  Nontender.  No distention.  Other:  Bilateral calves are nonswollen and nontender.   ED Results / Procedures / Treatments  Labs (all labs ordered are listed, but only abnormal results are displayed) Labs Reviewed  COMPREHENSIVE METABOLIC PANEL -  Abnormal; Notable for the following components:      Result Value   Potassium 3.3 (*)    Glucose, Bld 158 (*)    BUN 5 (*)    ALT 116 (*)    All other components within normal limits  CBC WITH DIFFERENTIAL/PLATELET - Abnormal; Notable for the following components:   WBC 12.1 (*)    RBC 5.31 (*)    Neutro Abs 8.3 (*)    All other components within normal limits  URINALYSIS, ROUTINE W REFLEX MICROSCOPIC - Abnormal; Notable for the following components:   Ketones, ur TRACE (*)    Leukocytes,Ua TRACE (*)    All other components within normal limits  POC URINE PREG, ED - Abnormal; Notable for the following components:   Preg Test, Ur POSITIVE (*)    All other components within normal limits  RESP PANEL BY RT-PCR (FLU A&B, COVID) ARPGX2  URINALYSIS, MICROSCOPIC (REFLEX)  TROPONIN I (HIGH SENSITIVITY)  TROPONIN I (HIGH SENSITIVITY)     EKG  ED ECG REPORT I, Monroe Toure J, the attending physician, personally viewed and interpreted this ECG.   Date: 04/20/2022  EKG Time: 1728  Rate: 140  Rhythm: sinus tachycardia  Axis: Normal  Intervals: QTc 552  ST&T Change: Nonspecific    RADIOLOGY None   Official radiology report(s): No results found.   PROCEDURES:  Critical Care performed: No  .1-3 Lead EKG Interpretation  Performed by: Irean Hong, MD Authorized by: Irean Hong, MD     Interpretation: abnormal     ECG rate:  107   ECG rate assessment: tachycardic     Rhythm: sinus tachycardia     Ectopy: none     Conduction: normal   Comments:     Patient placed on cardiac monitor to evaluate for arrhythmias    MEDICATIONS ORDERED IN ED: Medications  sodium chloride 0.9 % bolus 1,000 mL (0 mLs Intravenous Stopped 04/20/22 2247)  methylPREDNISolone sodium succinate (SOLU-MEDROL) 125 mg/2 mL injection 125 mg (125 mg Intravenous Given 04/20/22 1810)  albuterol (PROVENTIL) (2.5 MG/3ML) 0.083% nebulizer solution 2.5 mg (2.5 mg Nebulization Given 04/20/22 1810)  sodium  chloride 0.9 % bolus 1,000 mL (1,000 mLs Intravenous New Bag/Given 04/20/22 2339)     IMPRESSION / MDM / ASSESSMENT AND PLAN / ED COURSE  I reviewed the triage vital signs and the nursing notes.  21 year old female approximately [redacted] weeks pregnant recently discharged from the hospital for hyperemesis gravidarum who returns for asthma exacerbation. Differential includes, but is not limited to, viral syndrome, bronchitis including COPD exacerbation, pneumonia, reactive airway disease including asthma, CHF including exacerbation with or without pulmonary/interstitial edema, pneumothorax, ACS, thoracic trauma, and pulmonary embolism.  I have personally reviewed patient's records and note her recent hospitalization 7/13 - 04/19/2022.  Patient's presentation is most consistent with exacerbation of chronic illness.  The patient is on the cardiac monitor to evaluate for evidence of arrhythmia and/or significant heart rate changes.  Laboratory results demonstrate mild leukocytosis WBC 12.1, mild hypokalemia potassium 3.3, initial negative troponin, respiratory panel negative.  By the time of my examination and interview, patient had received 1 L IV fluids, albuterol nebulizer and 125 mg IV Solu-Medrol.  Heart rate has improved tremendously, she is resting comfortably without wheezing on exam.  Will administer second liter IV fluids, check repeat troponin and reassess.  Clinical Course as of 04/21/22 0024  Mon Apr 21, 2022  0023 Patient resting in no acute distress.  Heart rate 96, room air saturation 97%.  No wheezing on reexamination.  Repeat troponin remains negative.  Patient has antiemetic prescriptions at home.  We will continue prednisone x4 days, prescribed albuterol inhaler and nebulizer to use at home.  Strict return precautions given.  Patient verbalizes understanding and agrees with plan of care. [JS]    Clinical Course User Index [JS] Irean Hong, MD     FINAL  CLINICAL IMPRESSION(S) / ED DIAGNOSES   Final diagnoses:  Moderate asthma with exacerbation, unspecified whether persistent     Rx / DC Orders   ED Discharge Orders     None        Note:  This document was prepared using Dragon voice recognition software and may include unintentional dictation errors.   Irean Hong, MD 04/21/22 [phone]

## 2022-04-20 NOTE — ED Provider Triage Note (Signed)
Emergency Medicine Provider Triage Evaluation Note  April Olsen , a 21 y.o. female  was evaluated in triage.  Pt complains of shortness of breath, chest tightness.  Patient presents to the ED complaining of shortness of breath, chest tightness since yesterday.  Shortness of breath began first and chest tightness accompanied today.  Patient is pregnant, was recently discharged for hyperemesis gravidarum.  Patient has no pregnancy complaints at this time.  No abdominal pain, vomiting, vaginal bleeding or discharge..  Review of Systems  Positive: Shortness of breath, chest tightness Negative: Rash, emesis, vaginal bleeding, discharge, abdominal pain  Physical Exam  BP 106/63   Pulse (!) 148   Temp 98.2 F (36.8 C)   Resp 16   Ht 5\' 6"  (1.676 m)   Wt 72.6 kg   LMP 02/17/2022   SpO2 96%   BMI 25.82 kg/m  Gen:   Awake, no distress   Resp:  Normal effort.  Auscultation reveals wheezing bilaterally. MSK:   Moves extremities without difficulty  Other:    Medical Decision Making  Medically screening exam initiated at 5:48 PM.  Appropriate orders placed.  April Olsen was informed that the remainder of the evaluation will be completed by another provider, this initial triage assessment does not replace that evaluation, and the importance of remaining in the ED until their evaluation is complete.  Patient presents with shortness of breath, chest tightness.  She does have a history of asthma but has not had an exacerbation in years.  No rescue inhaler at home.  Patient was recently seen for hyperemesis gravidarum but has no pregnancy related complaints at this time.  Patient will have EKG, labs, chest x-ray.  Based off of physical exam no wheezing, reproducible chest tenderness to palpation I suspect component of asthma for patient's symptoms.  Patient will of steroid and albuterol at this time.   Marcy Siren, PA-C 04/20/22 1748

## 2022-04-20 NOTE — ED Triage Notes (Signed)
Pt via EMS from home. Pt was discharged from [facility] ED last night for emesis. Pt states she took the PO Zofran and states since then she been having some SOB and this AM chest pain. Pt states she has had the chest pain this AM. Pt states she was an asthmatic as a kid but has not had a flair up in years.   Pt is [redacted] weeks pregnant

## 2022-04-21 LAB — TROPONIN I (HIGH SENSITIVITY): Troponin I (High Sensitivity): 8 ng/L (ref <18)

## 2022-04-21 MED ORDER — ALBUTEROL SULFATE 1.25 MG/3ML IN NEBU: 1.0000 | INHALATION_SOLUTION | RESPIRATORY_TRACT | 0 refills | Status: AC | PRN

## 2022-04-21 MED ORDER — ALBUTEROL SULFATE HFA 108 (90 BASE) MCG/ACT IN AERS: 2.0000 | INHALATION_SPRAY | RESPIRATORY_TRACT | 0 refills | Status: AC | PRN

## 2022-04-21 MED ORDER — PREDNISONE 20 MG PO TABS: ORAL_TABLET | ORAL | 0 refills | Status: AC

## 2022-04-21 MED ORDER — COMPRESSOR/NEBULIZER MISC: 1.0000 [IU] | 0 refills | Status: AC | PRN

## 2022-04-21 NOTE — Discharge Instructions (Signed)
1.  Take Prednisone 60 mg daily until finished.  Start your next dose Monday. 2.  You may use albuterol inhaler or nebulizer every 4 hours as needed for wheezing/difficulty breathing. 3.  You should have your doctor repeat an EKG and several days time. 4.  Return to the ER for worsening symptoms, persistent vomiting, difficulty breathing or other concerns.

## 2022-07-15 ENCOUNTER — Encounter: Payer: Self-pay | Admitting: Certified Nurse Midwife

## 2022-07-15 ENCOUNTER — Encounter: Payer: Self-pay | Admitting: Obstetrics

## 2022-11-16 IMAGING — US US OB < 14 WEEKS - US OB TV
1 series · 14 of 28 positions shown · non-contrast
Comparison: None.

CLINICAL DATA: Pregnant patient with abdominal cramping.

EXAM:
OBSTETRIC <14 WK US AND TRANSVAGINAL OB US
TECHNIQUE: Both transabdominal and transvaginal ultrasound examinations were
performed for complete evaluation of the gestation as well as the
maternal uterus, adnexal regions, and pelvic cul-de-sac.
Transvaginal technique was performed to assess early pregnancy.

[Series 1: us ob comp less 14 wks · 14 of 110 slices shown]
[im 5/110]
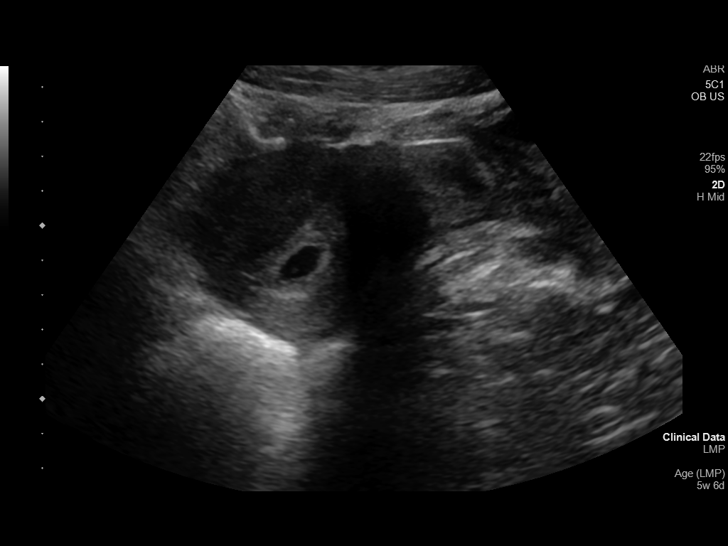
[im 13/110]
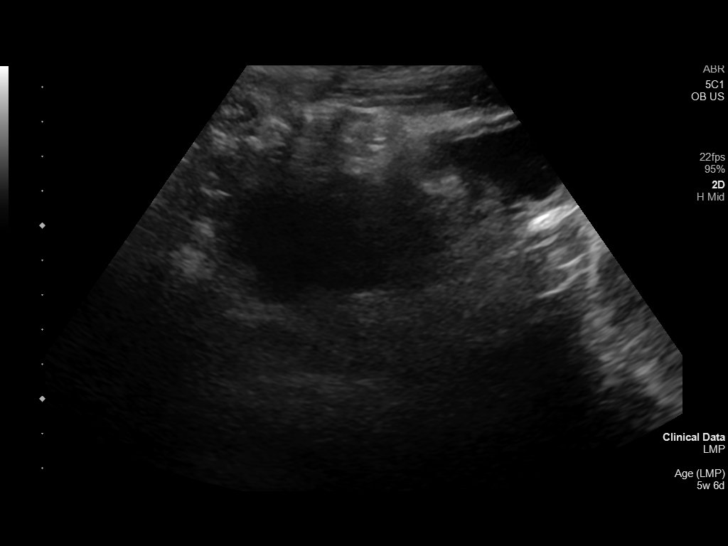
[im 21/110]
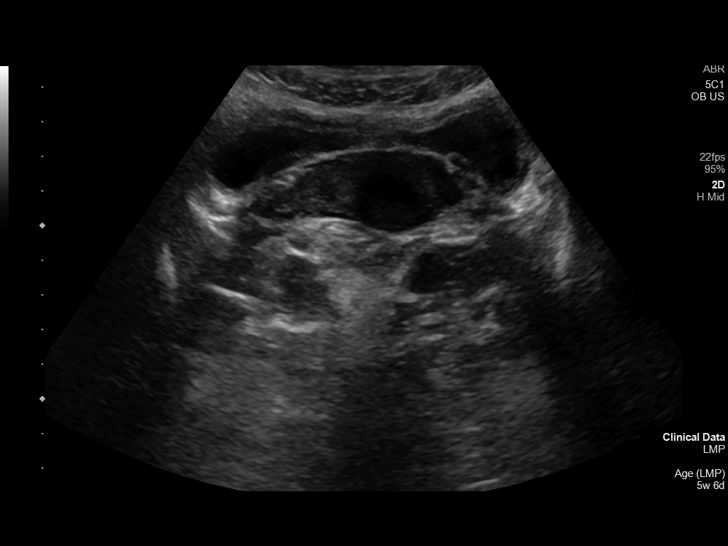
[im 29/110]
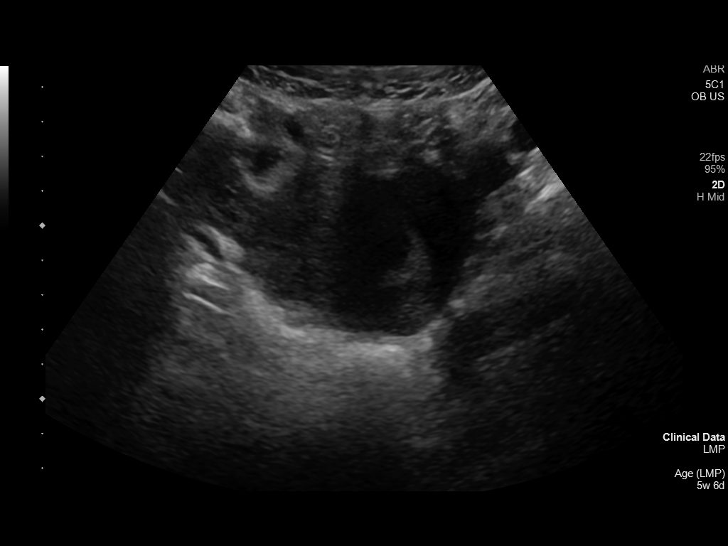
[im 37/110]
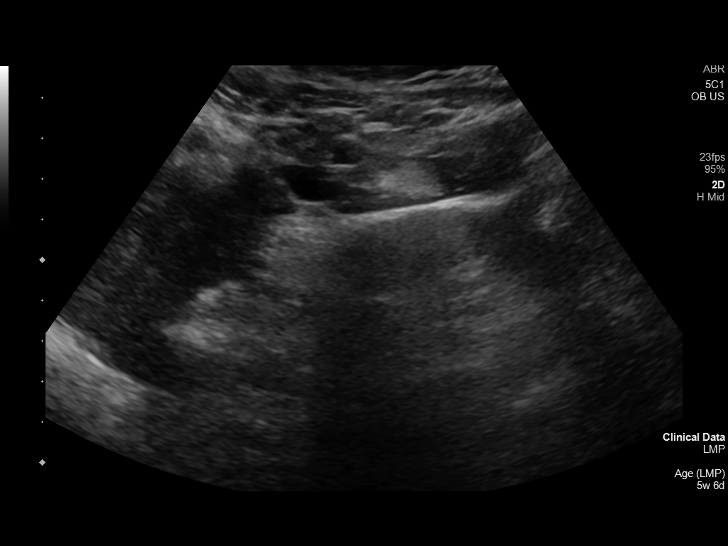
[im 45/110]
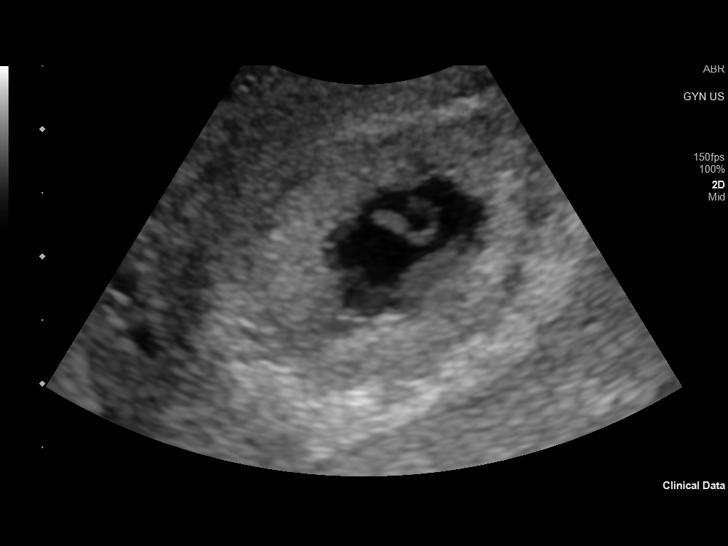
[im 53/110]
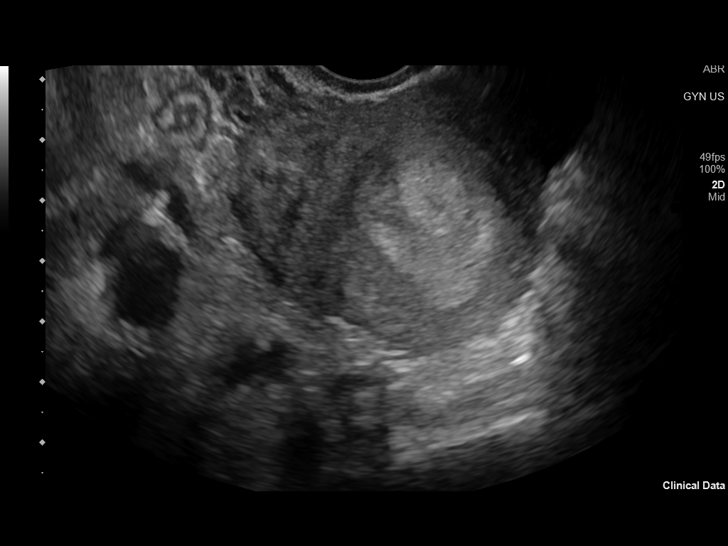
[im 61/110]
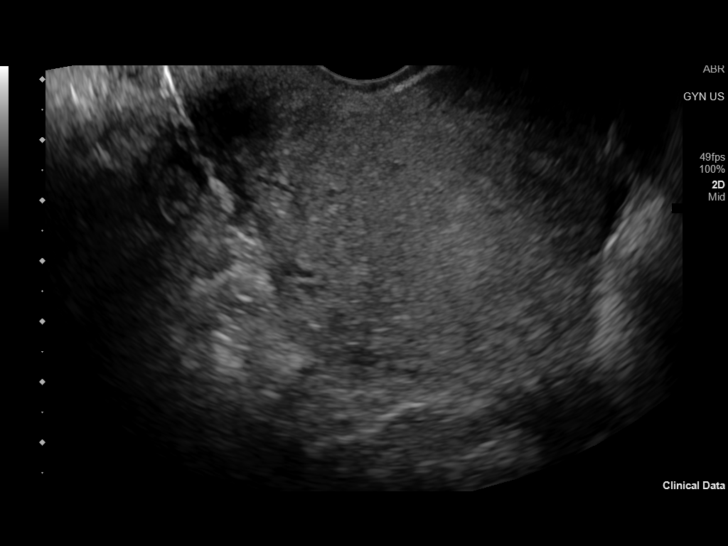
[im 69/110]
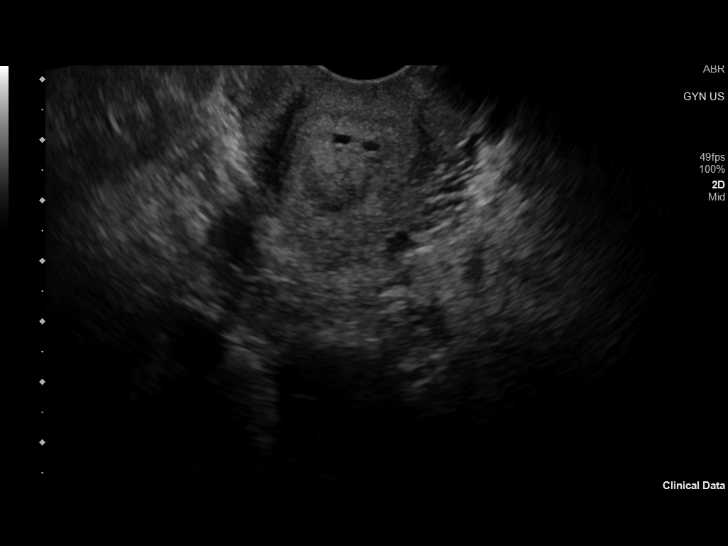
[im 77/110]
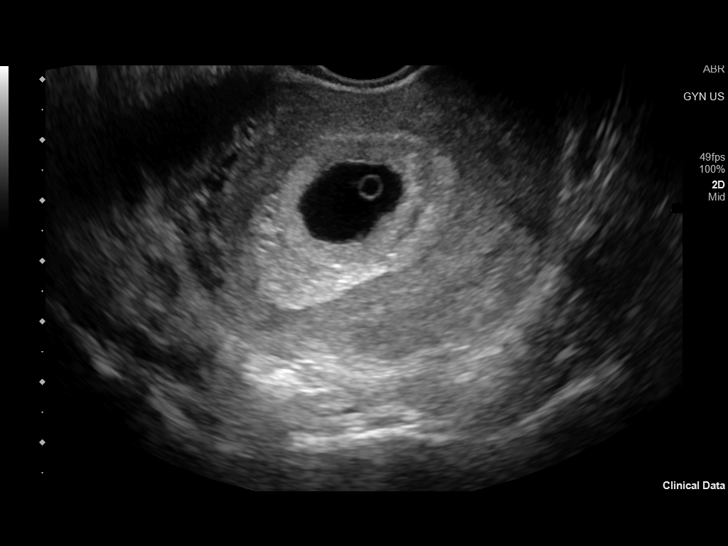
[im 85/110]
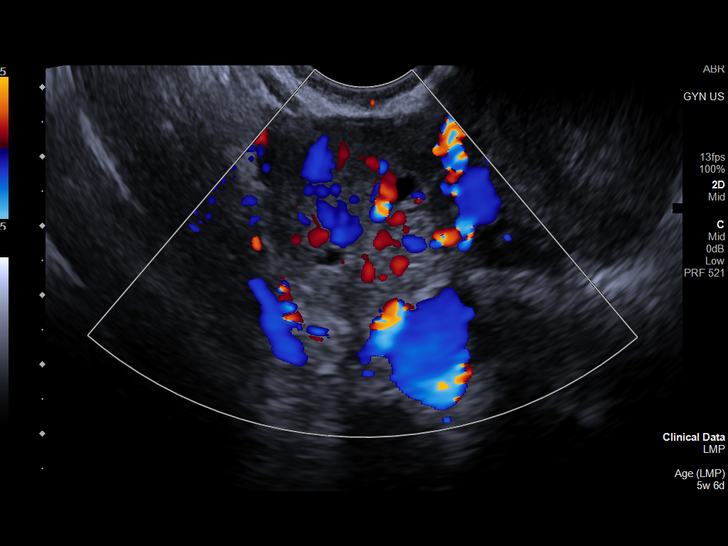
[im 93/110]
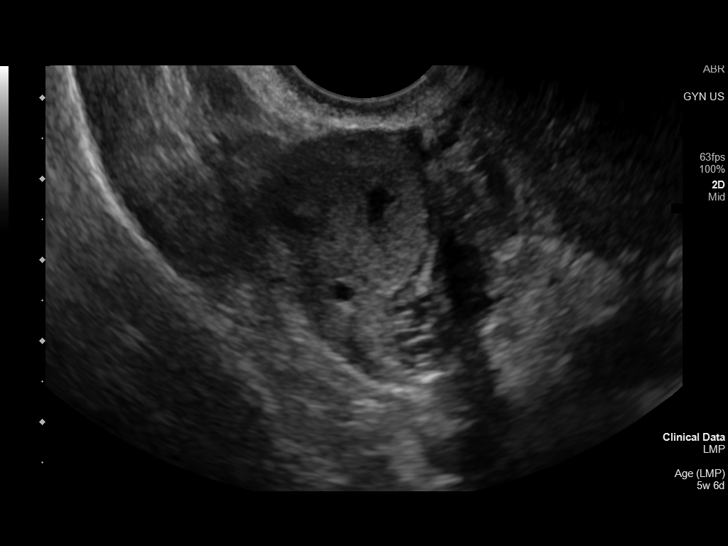
[im 101/110]
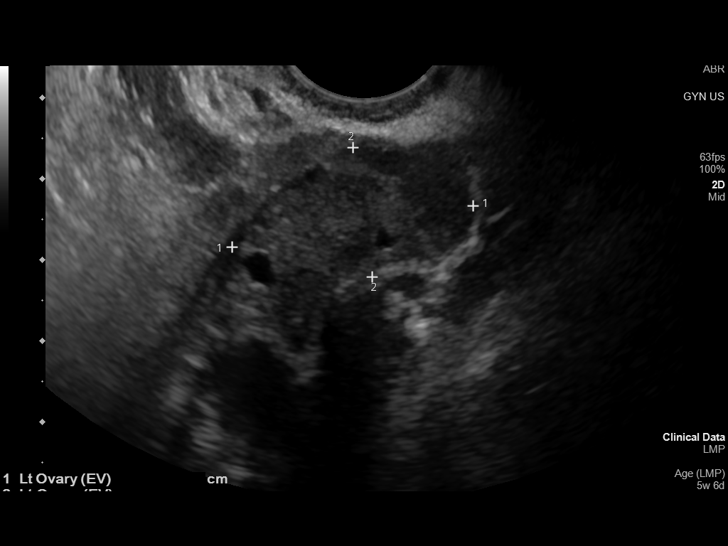
[im 110/110]
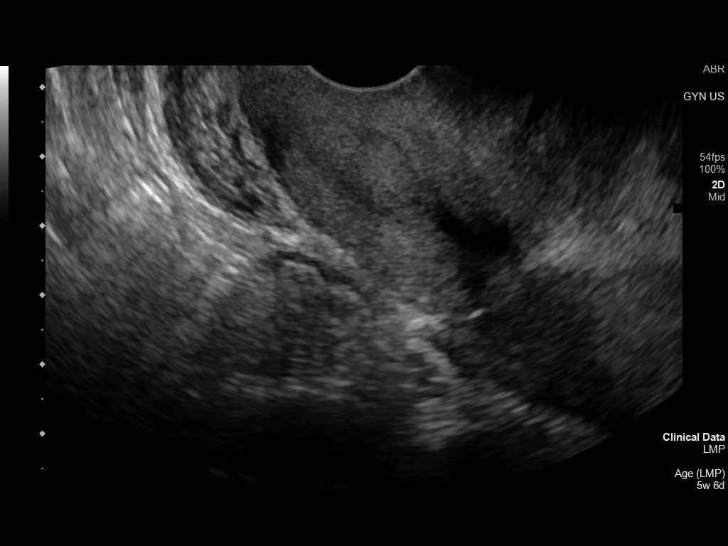

[14 of 28 positions shown; findings below may reference images not displayed]

FINDINGS: Intrauterine gestational sac: Single

Yolk sac:  Visualized.

Embryo:  Visualized.

Cardiac Activity: Visualized.

Heart Rate: 99 bpm

CRL:  3.1 mm   5 w   6 d                  US EDC: 11/17/2021

Subchorionic hemorrhage:  None

Maternal uterus/adnexae: Right ovarian corpus luteum. Normal left
ovary. No free fluid in the pelvis.
IMPRESSION: Single live intrauterine gestation.  No subchorionic hemorrhage.
